# Patient Record
Sex: Female | Born: 1975 | Race: Black or African American | Hispanic: No | Marital: Single | Smoking: Current every day smoker
Health system: Southern US, Community
[De-identification: ages and names within clinical notes are randomized; demographics above are authoritative.]

## PROBLEM LIST (undated history)

## (undated) DIAGNOSIS — I10 Essential (primary) hypertension: Secondary | ICD-10-CM

## (undated) HISTORY — PX: ECTOPIC PREGNANCY SURGERY: SHX613

## (undated) HISTORY — DX: Essential (primary) hypertension: I10

## (undated) HISTORY — PX: SKIN BIOPSY: SHX1

---

## 1999-05-17 ENCOUNTER — Encounter: Payer: Self-pay | Admitting: Emergency Medicine

## 1999-05-17 ENCOUNTER — Emergency Department (HOSPITAL_COMMUNITY): Admission: EM | Admit: 1999-05-17 | Discharge: 1999-05-17 | Payer: Self-pay | Admitting: Emergency Medicine

## 2000-11-30 ENCOUNTER — Emergency Department (HOSPITAL_COMMUNITY): Admission: EM | Admit: 2000-11-30 | Discharge: 2000-11-30 | Payer: Self-pay | Admitting: Emergency Medicine

## 2000-11-30 ENCOUNTER — Encounter: Payer: Self-pay | Admitting: Emergency Medicine

## 2000-12-21 ENCOUNTER — Emergency Department (HOSPITAL_COMMUNITY): Admission: EM | Admit: 2000-12-21 | Discharge: 2000-12-21 | Payer: Self-pay | Admitting: Emergency Medicine

## 2000-12-23 ENCOUNTER — Emergency Department (HOSPITAL_COMMUNITY): Admission: EM | Admit: 2000-12-23 | Discharge: 2000-12-23 | Payer: Self-pay | Admitting: Emergency Medicine

## 2001-01-31 ENCOUNTER — Emergency Department (HOSPITAL_COMMUNITY): Admission: EM | Admit: 2001-01-31 | Discharge: 2001-01-31 | Payer: Self-pay | Admitting: Emergency Medicine

## 2001-09-22 ENCOUNTER — Encounter: Payer: Self-pay | Admitting: Emergency Medicine

## 2001-09-22 ENCOUNTER — Emergency Department (HOSPITAL_COMMUNITY): Admission: EM | Admit: 2001-09-22 | Discharge: 2001-09-22 | Payer: Self-pay | Admitting: Emergency Medicine

## 2001-12-28 ENCOUNTER — Emergency Department (HOSPITAL_COMMUNITY): Admission: EM | Admit: 2001-12-28 | Discharge: 2001-12-28 | Payer: Self-pay | Admitting: Emergency Medicine

## 2001-12-28 ENCOUNTER — Encounter: Payer: Self-pay | Admitting: Emergency Medicine

## 2002-02-22 ENCOUNTER — Emergency Department (HOSPITAL_COMMUNITY): Admission: EM | Admit: 2002-02-22 | Discharge: 2002-02-22 | Payer: Self-pay | Admitting: Emergency Medicine

## 2002-03-04 ENCOUNTER — Emergency Department (HOSPITAL_COMMUNITY): Admission: EM | Admit: 2002-03-04 | Discharge: 2002-03-04 | Payer: Self-pay

## 2002-07-26 ENCOUNTER — Emergency Department (HOSPITAL_COMMUNITY): Admission: EM | Admit: 2002-07-26 | Discharge: 2002-07-26 | Payer: Self-pay | Admitting: Emergency Medicine

## 2003-09-13 ENCOUNTER — Inpatient Hospital Stay (HOSPITAL_COMMUNITY): Admission: AD | Admit: 2003-09-13 | Discharge: 2003-09-13 | Payer: Self-pay | Admitting: Obstetrics & Gynecology

## 2003-12-06 ENCOUNTER — Emergency Department (HOSPITAL_COMMUNITY): Admission: EM | Admit: 2003-12-06 | Discharge: 2003-12-06 | Payer: Self-pay | Admitting: Family Medicine

## 2004-05-27 ENCOUNTER — Emergency Department (HOSPITAL_COMMUNITY): Admission: EM | Admit: 2004-05-27 | Discharge: 2004-05-27 | Payer: Self-pay | Admitting: Emergency Medicine

## 2004-07-03 ENCOUNTER — Emergency Department (HOSPITAL_COMMUNITY): Admission: EM | Admit: 2004-07-03 | Discharge: 2004-07-03 | Payer: Self-pay | Admitting: Emergency Medicine

## 2004-07-06 ENCOUNTER — Emergency Department (HOSPITAL_COMMUNITY): Admission: EM | Admit: 2004-07-06 | Discharge: 2004-07-06 | Payer: Self-pay | Admitting: Family Medicine

## 2004-11-17 ENCOUNTER — Inpatient Hospital Stay (HOSPITAL_COMMUNITY): Admission: AD | Admit: 2004-11-17 | Discharge: 2004-11-18 | Payer: Self-pay | Admitting: Obstetrics & Gynecology

## 2004-11-21 ENCOUNTER — Inpatient Hospital Stay (HOSPITAL_COMMUNITY): Admission: AD | Admit: 2004-11-21 | Discharge: 2004-11-21 | Payer: Self-pay | Admitting: Obstetrics and Gynecology

## 2004-11-24 ENCOUNTER — Inpatient Hospital Stay (HOSPITAL_COMMUNITY): Admission: AD | Admit: 2004-11-24 | Discharge: 2004-11-24 | Payer: Self-pay | Admitting: Family Medicine

## 2004-11-25 ENCOUNTER — Inpatient Hospital Stay (HOSPITAL_COMMUNITY): Admission: AD | Admit: 2004-11-25 | Discharge: 2004-11-25 | Payer: Self-pay | Admitting: Obstetrics and Gynecology

## 2004-11-28 ENCOUNTER — Inpatient Hospital Stay (HOSPITAL_COMMUNITY): Admission: AD | Admit: 2004-11-28 | Discharge: 2004-11-28 | Payer: Self-pay | Admitting: Obstetrics and Gynecology

## 2004-12-01 ENCOUNTER — Inpatient Hospital Stay (HOSPITAL_COMMUNITY): Admission: AD | Admit: 2004-12-01 | Discharge: 2004-12-01 | Payer: Self-pay | Admitting: Obstetrics and Gynecology

## 2004-12-07 ENCOUNTER — Inpatient Hospital Stay (HOSPITAL_COMMUNITY): Admission: AD | Admit: 2004-12-07 | Discharge: 2004-12-07 | Payer: Self-pay | Admitting: Obstetrics and Gynecology

## 2004-12-14 ENCOUNTER — Inpatient Hospital Stay (HOSPITAL_COMMUNITY): Admission: AD | Admit: 2004-12-14 | Discharge: 2004-12-14 | Payer: Self-pay | Admitting: Obstetrics and Gynecology

## 2004-12-26 ENCOUNTER — Inpatient Hospital Stay (HOSPITAL_COMMUNITY): Admission: AD | Admit: 2004-12-26 | Discharge: 2004-12-26 | Payer: Self-pay | Admitting: Obstetrics and Gynecology

## 2005-01-04 ENCOUNTER — Inpatient Hospital Stay (HOSPITAL_COMMUNITY): Admission: AD | Admit: 2005-01-04 | Discharge: 2005-01-04 | Payer: Self-pay | Admitting: Obstetrics and Gynecology

## 2005-01-18 ENCOUNTER — Inpatient Hospital Stay (HOSPITAL_COMMUNITY): Admission: AD | Admit: 2005-01-18 | Discharge: 2005-01-18 | Payer: Self-pay | Admitting: Obstetrics and Gynecology

## 2005-01-31 ENCOUNTER — Inpatient Hospital Stay (HOSPITAL_COMMUNITY): Admission: AD | Admit: 2005-01-31 | Discharge: 2005-01-31 | Payer: Self-pay | Admitting: Obstetrics and Gynecology

## 2005-02-24 ENCOUNTER — Inpatient Hospital Stay (HOSPITAL_COMMUNITY): Admission: AD | Admit: 2005-02-24 | Discharge: 2005-02-24 | Payer: Self-pay | Admitting: Obstetrics and Gynecology

## 2005-04-13 ENCOUNTER — Inpatient Hospital Stay (HOSPITAL_COMMUNITY): Admission: AD | Admit: 2005-04-13 | Discharge: 2005-04-13 | Payer: Self-pay | Admitting: Obstetrics and Gynecology

## 2005-08-28 ENCOUNTER — Inpatient Hospital Stay (HOSPITAL_COMMUNITY): Admission: AD | Admit: 2005-08-28 | Discharge: 2005-08-28 | Payer: Self-pay | Admitting: Family Medicine

## 2005-08-30 ENCOUNTER — Inpatient Hospital Stay (HOSPITAL_COMMUNITY): Admission: AD | Admit: 2005-08-30 | Discharge: 2005-08-30 | Payer: Self-pay | Admitting: *Deleted

## 2005-09-02 ENCOUNTER — Inpatient Hospital Stay (HOSPITAL_COMMUNITY): Admission: AD | Admit: 2005-09-02 | Discharge: 2005-09-02 | Payer: Self-pay | Admitting: Obstetrics & Gynecology

## 2005-09-07 ENCOUNTER — Inpatient Hospital Stay (HOSPITAL_COMMUNITY): Admission: AD | Admit: 2005-09-07 | Discharge: 2005-09-07 | Payer: Self-pay | Admitting: Obstetrics & Gynecology

## 2005-09-17 ENCOUNTER — Inpatient Hospital Stay (HOSPITAL_COMMUNITY): Admission: AD | Admit: 2005-09-17 | Discharge: 2005-09-17 | Payer: Self-pay | Admitting: Obstetrics

## 2005-09-19 ENCOUNTER — Ambulatory Visit (HOSPITAL_COMMUNITY): Admission: AD | Admit: 2005-09-19 | Discharge: 2005-09-19 | Payer: Self-pay | Admitting: Obstetrics

## 2005-09-19 ENCOUNTER — Encounter (INDEPENDENT_AMBULATORY_CARE_PROVIDER_SITE_OTHER): Payer: Self-pay | Admitting: Specialist

## 2005-11-29 ENCOUNTER — Emergency Department (HOSPITAL_COMMUNITY): Admission: EM | Admit: 2005-11-29 | Discharge: 2005-11-30 | Payer: Self-pay | Admitting: Emergency Medicine

## 2006-02-19 ENCOUNTER — Inpatient Hospital Stay (HOSPITAL_COMMUNITY): Admission: AD | Admit: 2006-02-19 | Discharge: 2006-02-19 | Payer: Self-pay | Admitting: Family Medicine

## 2006-02-21 ENCOUNTER — Inpatient Hospital Stay (HOSPITAL_COMMUNITY): Admission: AD | Admit: 2006-02-21 | Discharge: 2006-02-21 | Payer: Self-pay | Admitting: Family Medicine

## 2006-02-23 ENCOUNTER — Ambulatory Visit (HOSPITAL_COMMUNITY): Admission: AD | Admit: 2006-02-23 | Discharge: 2006-02-23 | Payer: Self-pay | Admitting: Obstetrics and Gynecology

## 2006-02-23 ENCOUNTER — Encounter (INDEPENDENT_AMBULATORY_CARE_PROVIDER_SITE_OTHER): Payer: Self-pay | Admitting: Specialist

## 2006-03-30 IMAGING — US US OB TRANSVAGINAL
1 series · 14 of 28 positions shown · non-contrast
Comparison: none

CLINICAL DATA: Given methotrexate on 11/18/04 for an ectopic pregnancy. However,
quantitative beta HCGs continue to increase. Lower abdominal pain.

ULTRASOUND OB TRANSVAGINAL:

[Series 1: us ob transvaginal · 0.15mm/px · 14 of 35 slices shown]
[im 2/35]
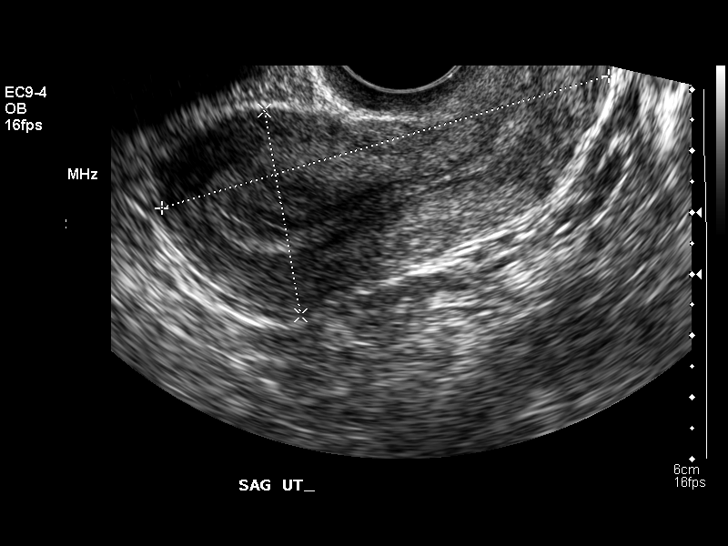
[im 4/35]
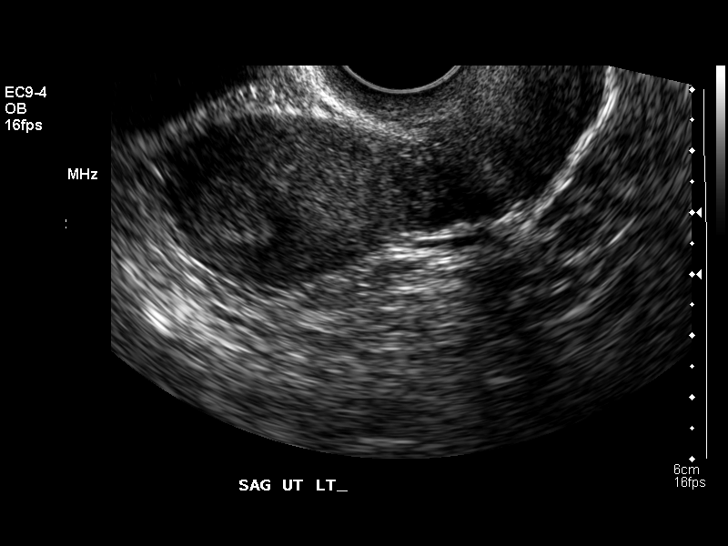
[im 7/35]
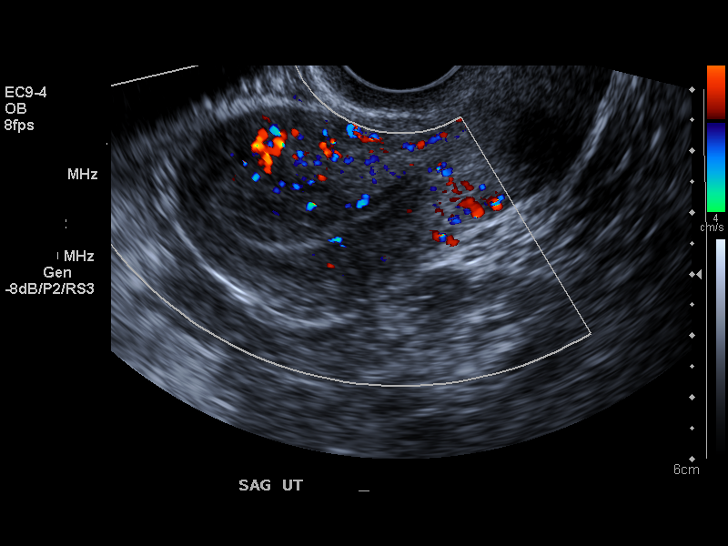
[im 9/35]
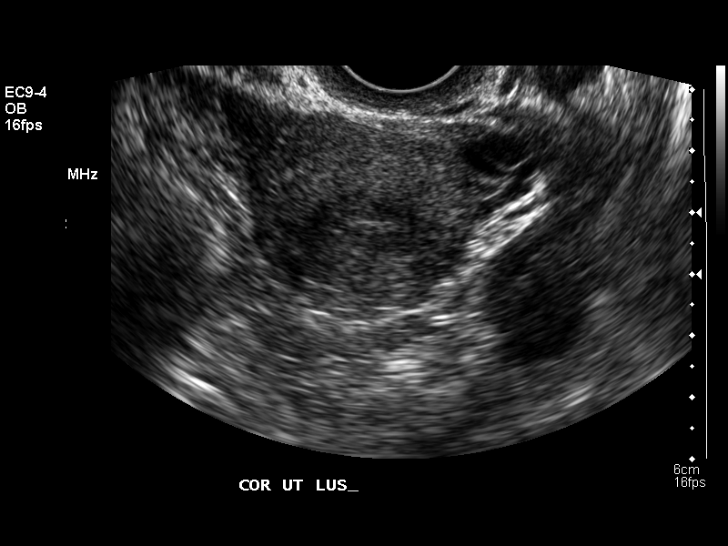
[im 12/35]
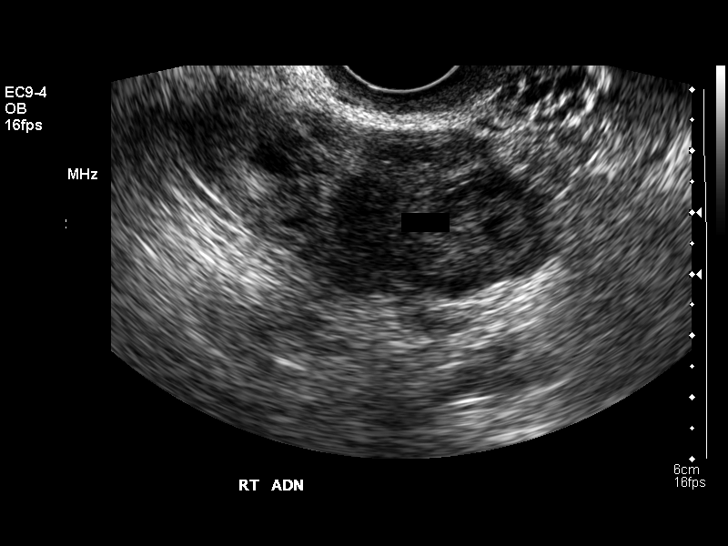
[im 14/35]
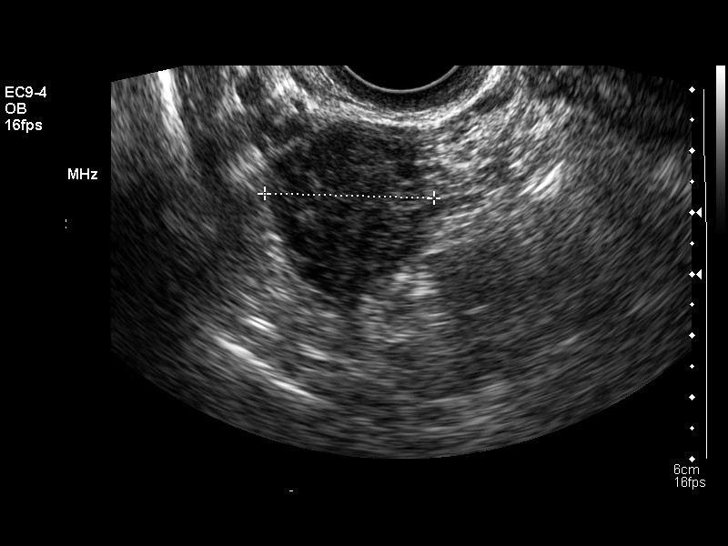
[im 17/35]
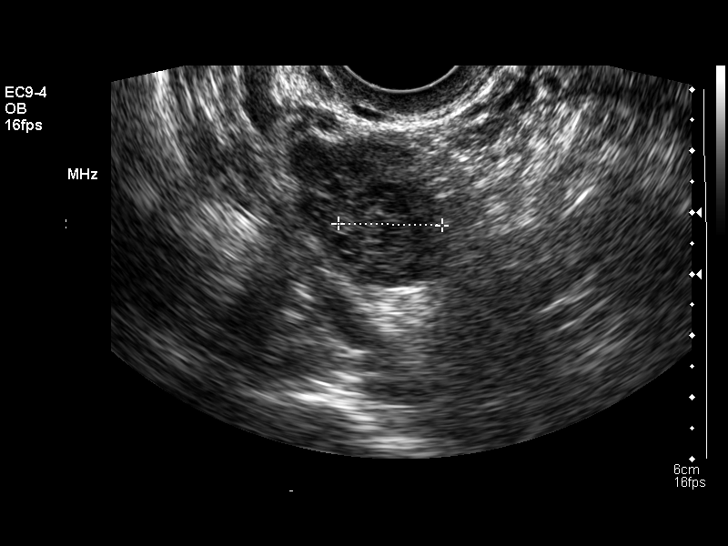
[im 19/35]
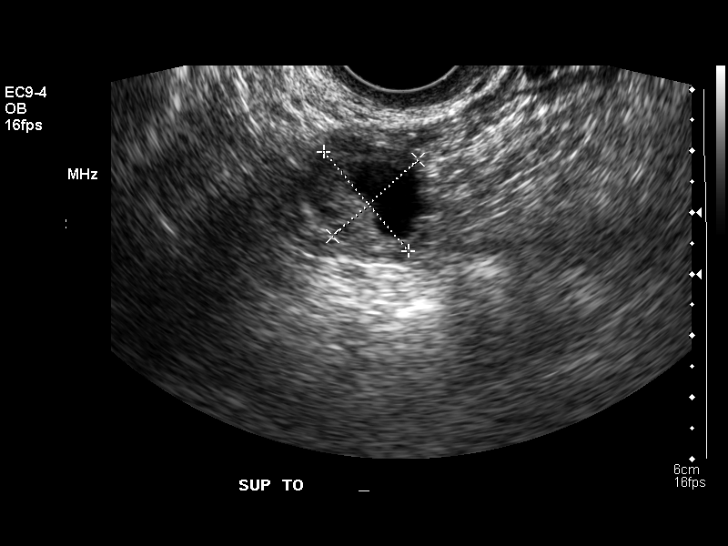
[im 22/35]
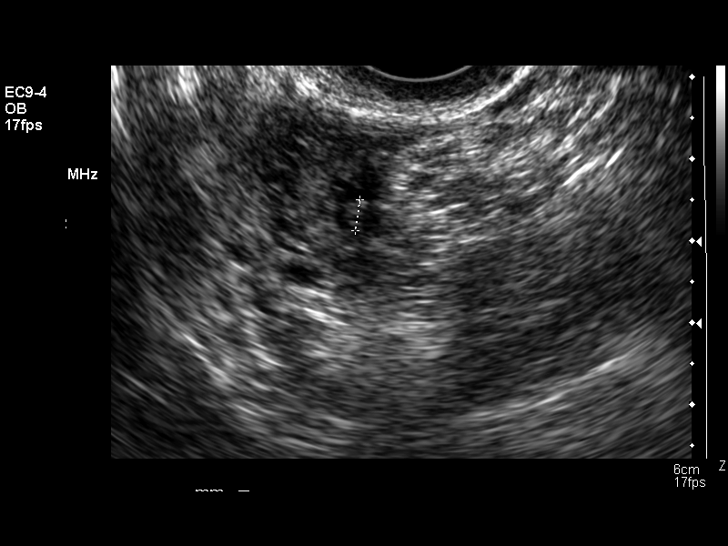
[im 24/35]
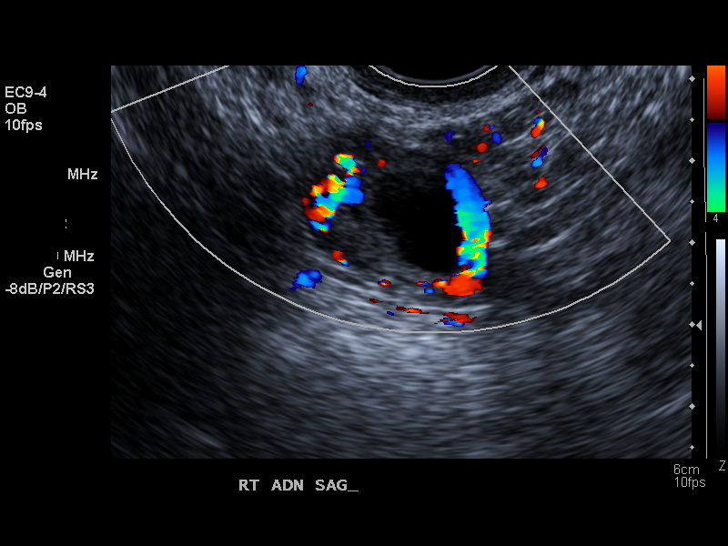
[im 27/35]
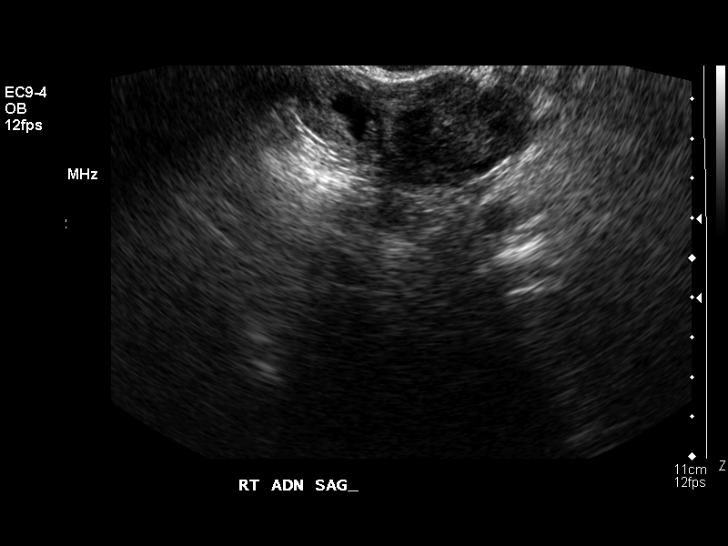
[im 29/35]
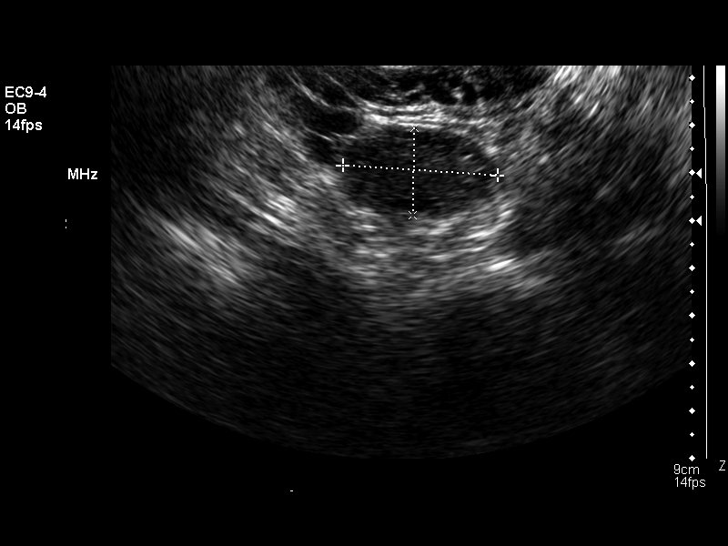
[im 32/35]
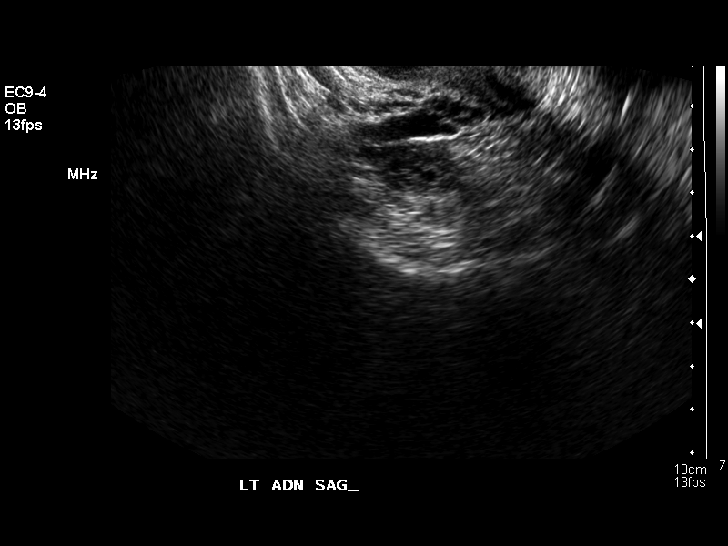
[im 35/35]
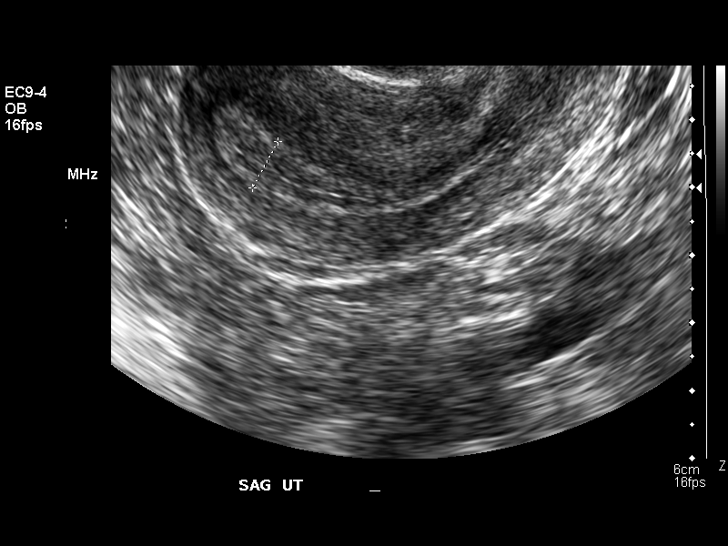

[14 of 28 positions shown; findings below may reference images not displayed]

FINDINGS: Now, there is a gestational sac noted within the right adnexal region.
This measures 2.1 x 1.9 x 1.7 cm. A yolk sac is present with possible fetal
pole. No fetal heart beat.

No intrauterine pregnancy. No free fluid. Left ovary normal.
IMPRESSION: Gestational sac with the yolk sac and possible fetal pole now seen within the
right adnexa. Maximum diameter of the area 2.1 cm. No intrauterine pregnancy.

## 2008-01-13 ENCOUNTER — Emergency Department (HOSPITAL_COMMUNITY): Admission: EM | Admit: 2008-01-13 | Discharge: 2008-01-13 | Payer: Self-pay | Admitting: Family Medicine

## 2008-09-28 ENCOUNTER — Emergency Department (HOSPITAL_COMMUNITY): Admission: EM | Admit: 2008-09-28 | Discharge: 2008-09-28 | Payer: Self-pay | Admitting: Emergency Medicine

## 2009-01-03 ENCOUNTER — Emergency Department (HOSPITAL_COMMUNITY): Admission: EM | Admit: 2009-01-03 | Discharge: 2009-01-03 | Payer: Self-pay | Admitting: Family Medicine

## 2009-07-07 ENCOUNTER — Emergency Department (HOSPITAL_COMMUNITY): Admission: EM | Admit: 2009-07-07 | Discharge: 2009-07-07 | Payer: Self-pay | Admitting: Family Medicine

## 2009-08-30 ENCOUNTER — Emergency Department (HOSPITAL_COMMUNITY): Admission: EM | Admit: 2009-08-30 | Discharge: 2009-08-30 | Payer: Self-pay | Admitting: Family Medicine

## 2009-09-06 ENCOUNTER — Emergency Department (HOSPITAL_COMMUNITY): Admission: EM | Admit: 2009-09-06 | Discharge: 2009-09-06 | Payer: Self-pay | Admitting: Emergency Medicine

## 2010-02-04 ENCOUNTER — Emergency Department (HOSPITAL_COMMUNITY): Admission: EM | Admit: 2010-02-04 | Discharge: 2010-02-04 | Payer: Self-pay | Admitting: Emergency Medicine

## 2010-03-17 ENCOUNTER — Emergency Department (HOSPITAL_COMMUNITY): Admission: EM | Admit: 2010-03-17 | Discharge: 2010-03-17 | Payer: Self-pay | Admitting: Emergency Medicine

## 2010-06-13 ENCOUNTER — Emergency Department (HOSPITAL_COMMUNITY)
Admission: EM | Admit: 2010-06-13 | Discharge: 2010-06-13 | Payer: Self-pay | Source: Home / Self Care | Admitting: Emergency Medicine

## 2010-10-05 NOTE — Op Note (Signed)
Julie Smith, STRUBEL NO.:  [id number]   MEDICAL RECORD NO.:  [id number]          PATIENT TYPE:  MAT   LOCATION:  [facility]                          FACILITY:  [facility]   PHYSICIAN:  Miguel Aschoff, M.D.       DATE OF BIRTH:  01/16/1976   DATE OF PROCEDURE:  02/23/2006  DATE OF DISCHARGE:                                 OPERATIVE REPORT   PREOPERATIVE DIAGNOSES:  Right ectopic pregnancy.   POSTOPERATIVE DIAGNOSES:  Right ectopic pregnancy.   PROCEDURE:  Laparoscopy with right partial salpingectomy.   SURGEON:  Miguel Aschoff, M.D.   ANESTHESIA:  General.   COMPLICATIONS:  None.   JUSTIFICATION:  The patient is a 35 year old black female, gravida 3, para  0, 0-2-0.  The patient has had history of two previous ectopic pregnancies,  one requiring a left salpingectomy.  She presented because of lower  abdominal pain, was diagnosed once again as having a right ectopic pregnancy  and this had been treated with methotrexate therapy on 02/23/2006.  The  patient reported increasing pain, was treated with Methotrexate on  02/21/2006.  The patient reported increasing pain on 02/23/2006, reporting  her pain 9 out of 10.  Because of increased symptomatology and a desire to  have the ectopic pregnancy resolved the patient is being taken to the  operating room to undergo laparoscopy.  It was discussed with the patient  and her partner as to the options available.  In view of this being her  third ectopic pregnancy the patient requested that a nonconservative  procedure be carried out and has given her informed consent for  salpingectomy.   PROCEDURE:  The patient was taken to the operating room, placed in the  supine position.  General anesthesia was administered without difficulty.  Once this was done, she was placed in dorsolithotomy position, prepped and  draped in usual sterile fashion.  Bladder was catheterized.  Once this was  done a Hulka tenaculum was placed through the cervix  and held.  Attention  was then directed to the umbilicus where a small infraumbilical incision was  made.  A Veress needle was inserted and the abdomen was insufflated with 3  liters of CO2.  Following this laparoscope was placed.  Inspection revealed  the uterus to be anterior, normal size and shape.  The left tube was  missing, consistent with a history of previous salpingectomy.  The left  ovary had some peri-ovarian adhesions but appeared to be normal.  Cul-de-sac  was unremarkable.  On the right however the patient had distal ampullary  ectopic pregnancy with a small amount of blood coming from the end of the  tube.  It was fusiform in shape and effecting the distal end of the tube.  No other abnormalities were noted in the pelvis.  At this point two  accessory ports were established, a 10 mm in the right lower quadrant and a  5 mm in the left lower quadrant.  The tube was elevated and then using the  gyrus unit the mesosalpinx was cauterized and cut.  This continued until the  ectopic pregnancy was detached and then the proximal portion of the tube was  cauterized and cut, leaving a small segment of proximal fallopian tube.  There was no fimbria left in this patient.  The specimen was excised.  The  excision site was inspected for hemostasis.  Hemostasis appeared to be  excellent.  At this point an EndoCatch bag was introduced and the specimen  was excised through the 10 mm port on the right without difficulty.  At this  point CO2 was allowed to escape.  Incisions closed using subcuticular 4-0  Vicryl.  The estimated blood loss was minimal from the procedure.  There was  a small amount of blood loss from the leaking ectopic pregnancy estimated to  be less than 20 mL.  Port sites were then injected with 0.25% Marcaine.  The  patient was reversed from the anesthetic and taken to recovery room in  satisfactory condition.  The ectopic pregnancy in the distal 2/3rds of the  tube were sent  for histologic study.   Plan is for the patient to be discharged home.  Medications for home include  Vicodin one every 3 hours as need for pain.  She is to set up a follow-up  visit in 4 weeks.  She is to call for any problems such as fever, pain or  heavy bleeding.  Place nothing per vagina for two weeks.      Miguel Aschoff, M.D.  Electronically Signed     AR/MEDQ  D:  02/23/2006  T:  02/24/2006  Job:  [id number]

## 2010-10-05 NOTE — Op Note (Signed)
Julie Smith, Julie Smith                   ACCOUNT NO.:  [id number]   MEDICAL RECORD NO.:  [id number]          PATIENT TYPE:  AMB   LOCATION:  [facility]                           FACILITY:  [facility]   PHYSICIAN:  Roseanna Rainbow, M.D.DATE OF BIRTH:  [birth year]   DATE OF PROCEDURE:  09/19/2005  DATE OF DISCHARGE:                                 OPERATIVE REPORT   PREOPERATIVE DIAGNOSIS:  Left ectopic pregnancy, rule out rupture.   POSTOPERATIVE DIAGNOSIS:  Left ectopic pregnancy, rule out rupture.   PROCEDURE:  Operative laparoscopy with left salpingectomy.   SURGEON:  Roseanna Rainbow, M.D.  Charles A. Clearance Coots, M.D.   ANESTHESIA:  General endotracheal anesthesia.   COMPLICATIONS:  None.   ESTIMATED BLOOD LOSS:  Minimal.   FINDINGS:  There was a hemoperitoneum noted approximately 50 to 100 mL.  The  ampullary portion of the left tube was dilated with products of conception  and blood clots.  There were fairly dense adhesions involving the omentum to  the peritoneum of the posterior cul-de-sac.  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  The  ovaries were normal appearing as well as the uterus.   PROCEDURE:  The patient is taken to the operating room with an IV running.  She was given general anesthesia and placed in the dorsal lithotomy position  and prepped and draped in the usual sterile fashion.  A speculum was placed  in the patient's vagina.  The anterior lip of the cervix was grasped with a  single-tooth tenaculum.  A Hulka manipulator was then advanced into the  uterus and secured to the anterior lip of the cervix as a means to  manipulate the uterus.  The single tooth tenaculum and speculum were then  removed.  A 1 cm periumbilical incision was then made with the scalpel.  The  Veress needle was then introduced into the abdominal cavity while tenting up  the anterior abdominal wall at a 45 degrees angle.   Intra-abdominal  placement was confirmed by saline drop test as well as an appropriate low  pressure reading upon insufflation of CO2 gas.  The abdomen was insufflated  with 3.5 liters of CO2 gas.  A trocar and sleeve were then advanced into the  abdomen where intra-abdominal placement was confirmed with the laparoscope.  A 5 mm left lower quadrant port was placed as well as a suprapubic port.  A  survey of the patient's pelvic anatomy revealed the above findings.  The  mesosalpinx of the left fallopian tube was then cauterized with bipolar  cautery and transected.  The pelvis was copiously irrigated and the  hemoperitoneum was evacuated.  The tube was then placed into an endocatch  and removed.  The fascia for the supraumbilical port was reapproximated with  interrupted sutures of 2-0 Vicryl.  The skin was reapproximated with 3-0  Monocryl and  Dermabond.  The Hulka manipulator was removed from the cervix with minimal  bleeding noted.  At the close of the procedure, the instrument and pack  counts  were said to be correct x2.  The patient was taken to the PACU awake  and in stable condition.      Roseanna Rainbow, M.D.  Electronically Signed     LAJ/MEDQ  D:  09/19/2005  T:  09/19/2005  Job:  [id number]

## 2011-01-03 IMAGING — CR DG CHEST 2V
2 series · 2 of 2 positions shown · non-contrast
Comparison: None.

CLINICAL DATA: Shortness of breath and cold symptoms.

CHEST - 2 VIEW

[view not recorded (1 of 2)]
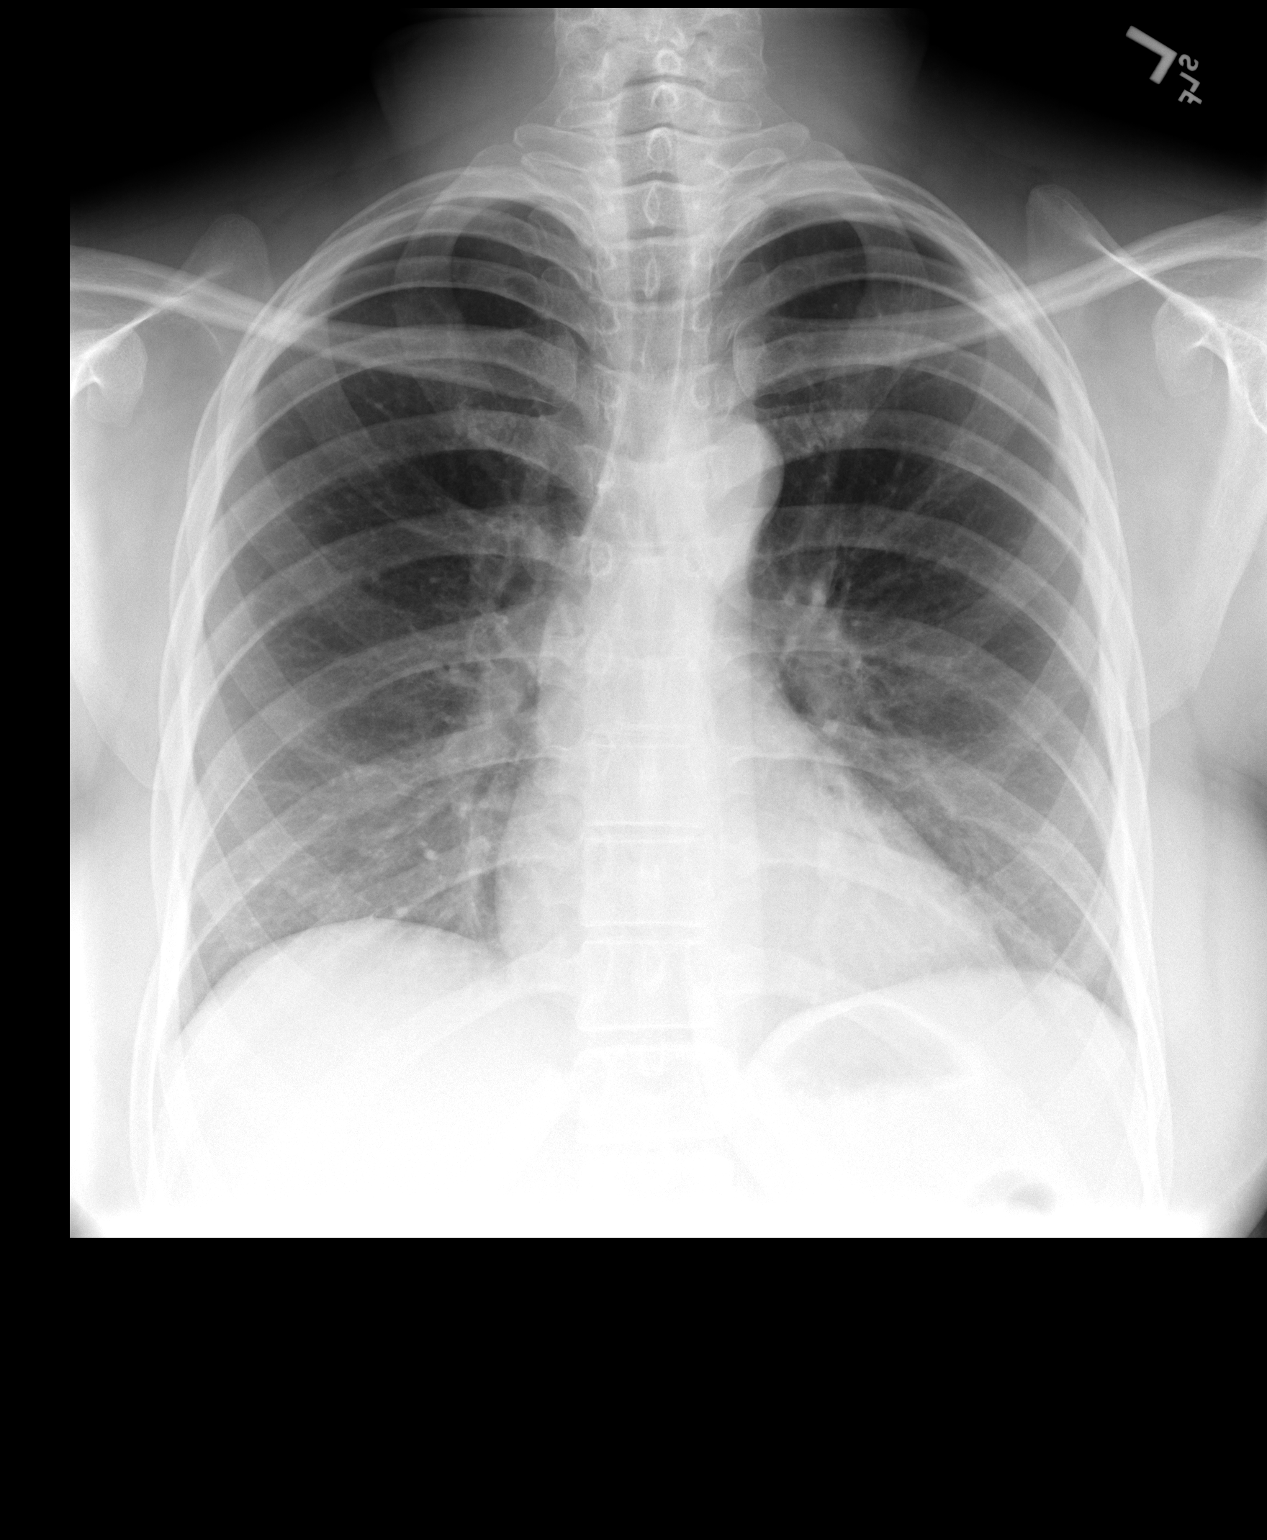

[view not recorded (2 of 2)]
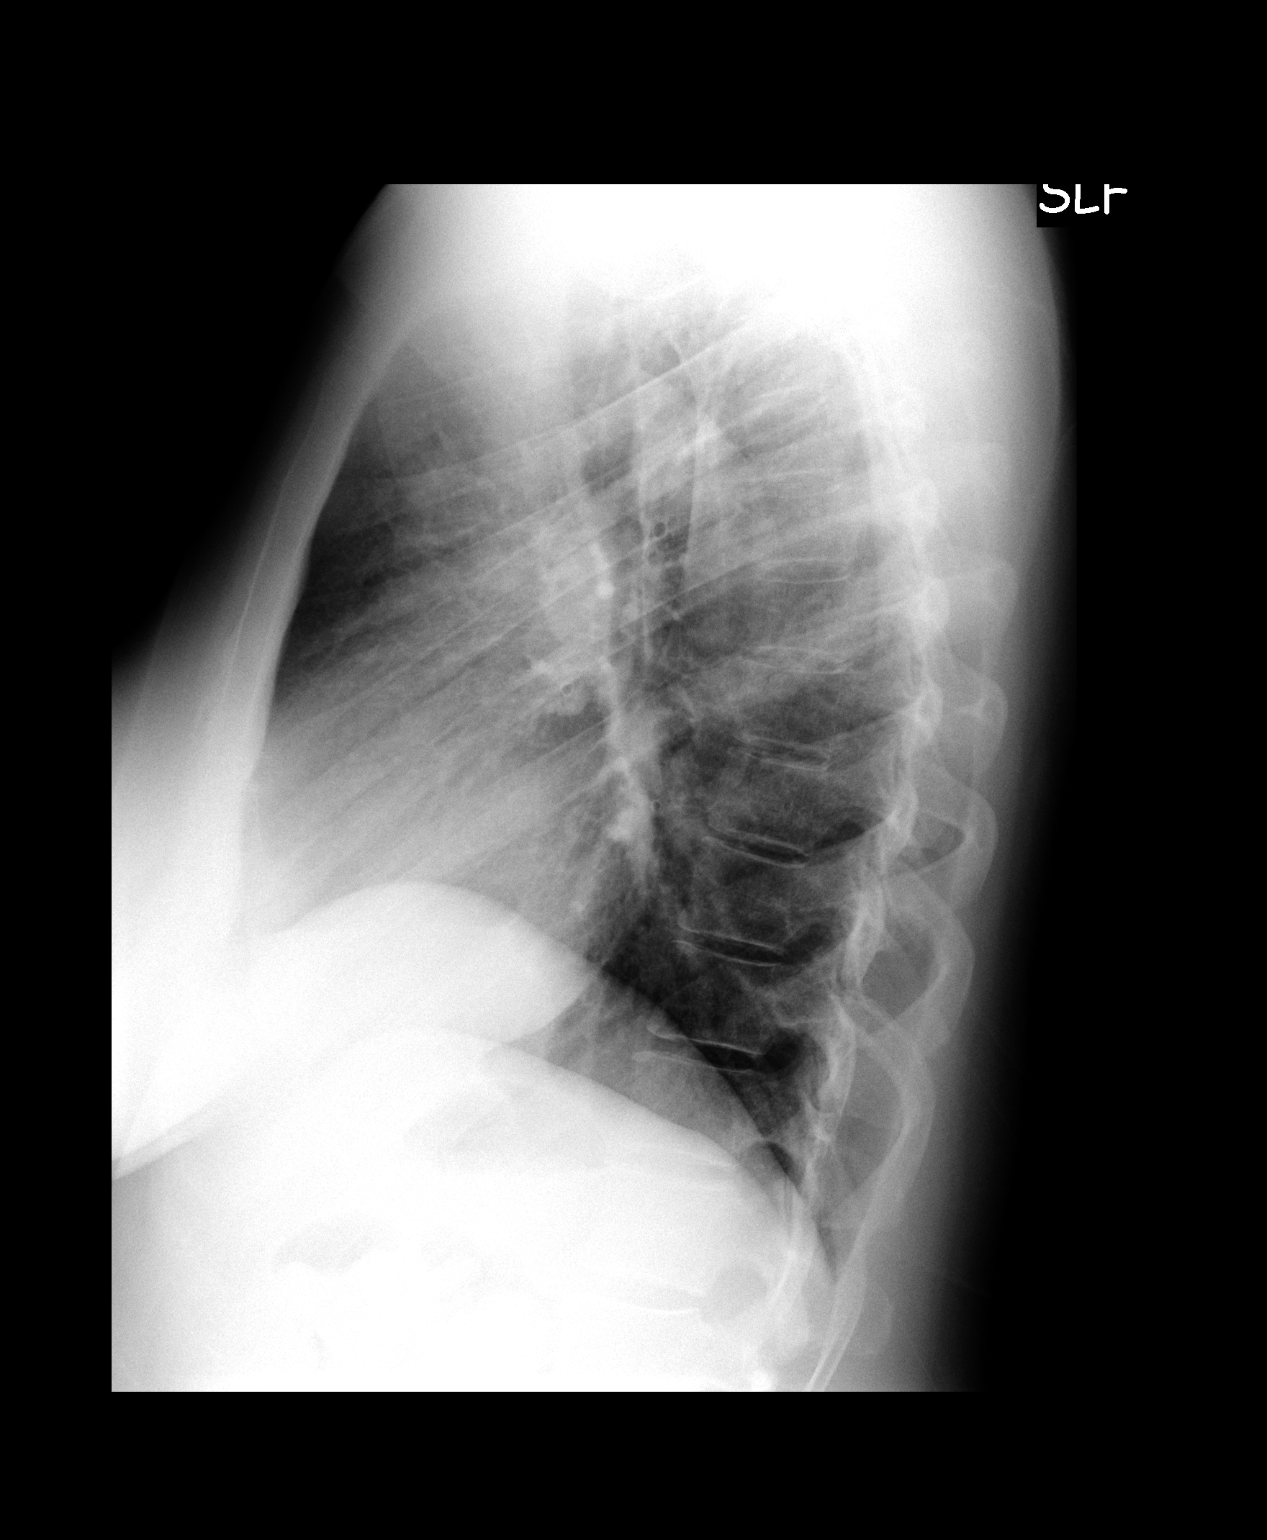

[2 of 2 positions shown; findings below may reference images not displayed]

FINDINGS: Two views of the chest were obtained.  Lungs clear
without focal airspace disease or edema.  Heart and mediastinum are
normal.  Trachea is midline.  Bony structures are intact.
IMPRESSION: No acute chest findings.

## 2011-01-10 IMAGING — CR DG CHEST 2V
2 series · 2 of 2 positions shown · non-contrast
Comparison: 08/30/2009

CLINICAL DATA: Short of breath.  Cough.

CHEST - 2 VIEW

[w chest pa]
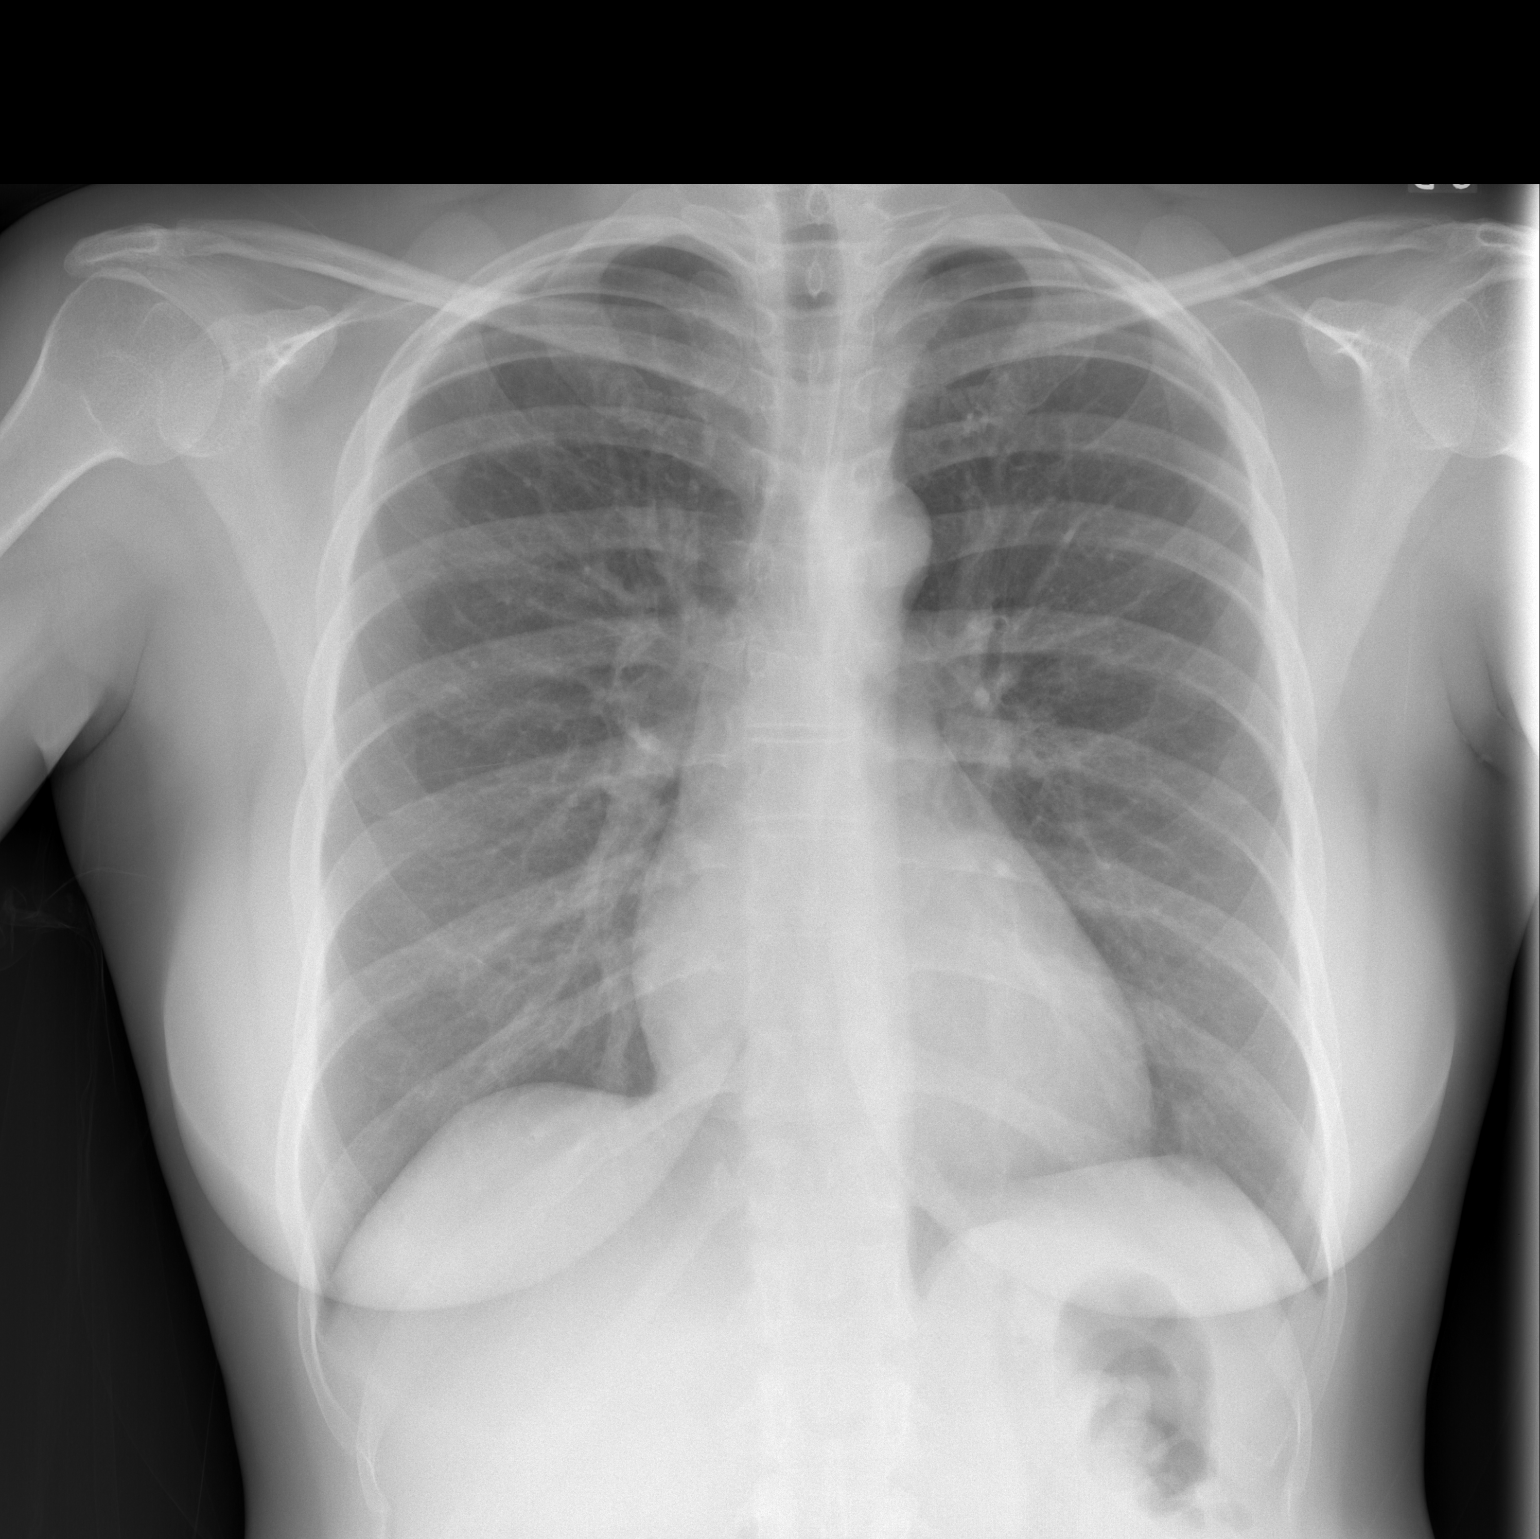

[w chest lat]
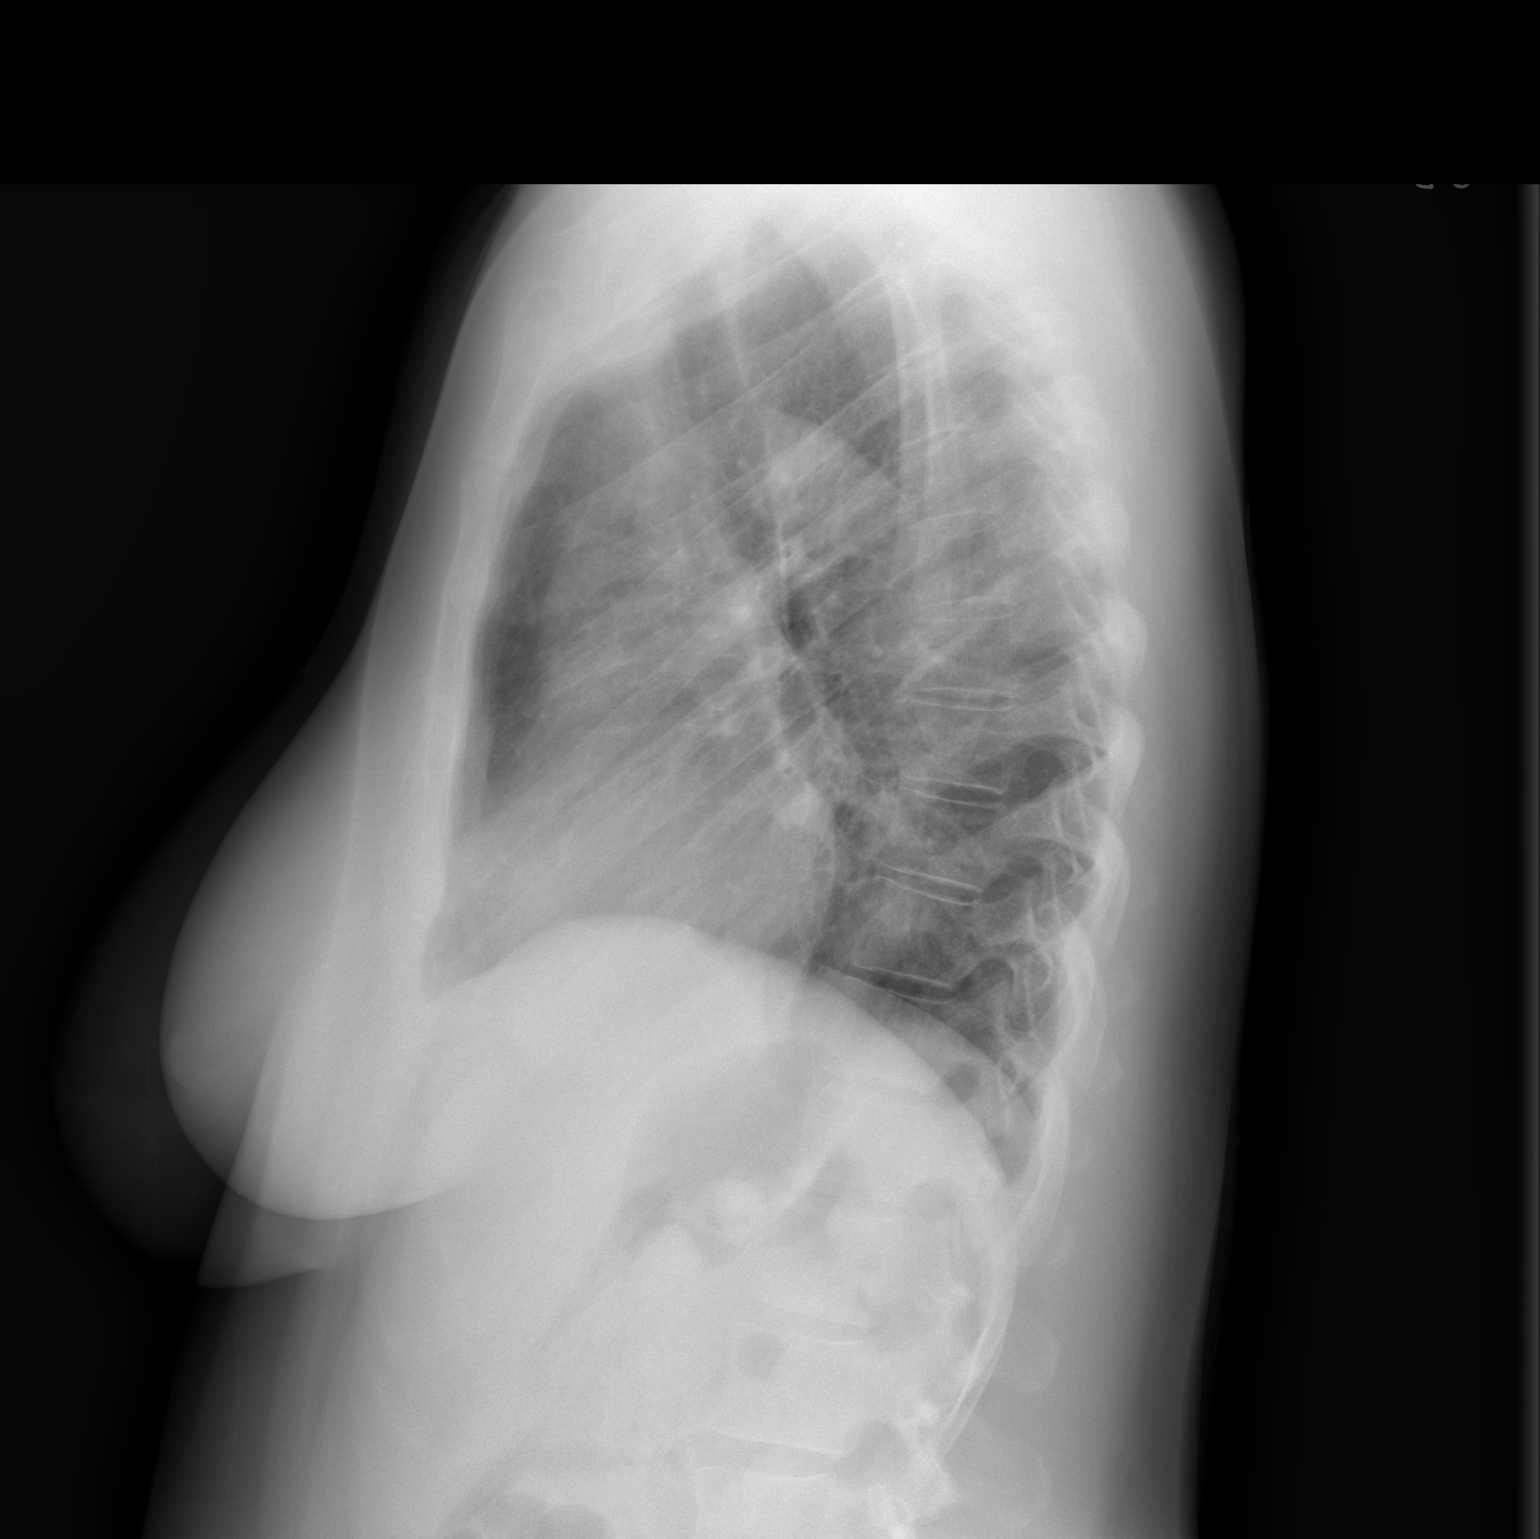

[2 of 2 positions shown; findings below may reference images not displayed]

FINDINGS: Heart size is normal.  The mediastinum is unremarkable.
There is bronchial thickening but no infiltrate, mass, effusion or
collapse.  No significant bony finding.
IMPRESSION: Bronchitis without consolidation or collapse.

## 2011-03-20 ENCOUNTER — Inpatient Hospital Stay (INDEPENDENT_AMBULATORY_CARE_PROVIDER_SITE_OTHER)
Admission: RE | Admit: 2011-03-20 | Discharge: 2011-03-20 | Disposition: A | Discharge status: Home / Self Care | Payer: Self-pay | Source: Ambulatory Visit | Attending: Family Medicine | Admitting: Family Medicine

## 2011-03-20 DIAGNOSIS — M545 Low back pain: Secondary | ICD-10-CM

## 2011-03-20 LAB — POCT URINALYSIS DIP (DEVICE)
Bilirubin Urine: NEGATIVE
Glucose, UA: NEGATIVE mg/dL
Hgb urine dipstick: NEGATIVE
Ketones, ur: NEGATIVE mg/dL
Nitrite: NEGATIVE
Specific Gravity, Urine: <=1.005 (ref 1.005–1.030)
pH: 6 (ref 5.0–8.0)

## 2011-03-22 LAB — URINE CULTURE
Colony Count: >=100000
Culture  Setup Time: 201210311825

## 2011-03-28 ENCOUNTER — Telehealth (HOSPITAL_COMMUNITY): Payer: Self-pay | Admitting: *Deleted

## 2011-03-28 NOTE — ED Notes (Signed)
Urine C and S: >100,000 colonies E. Coli.  Chart shown to Dr. Juanetta Gosling and order obtained for TMP-SMX DS tab po BID x 5 days #10.

## 2011-07-03 ENCOUNTER — Emergency Department (HOSPITAL_COMMUNITY)
Admission: EM | Admit: 2011-07-03 | Discharge: 2011-07-03 | Disposition: A | Discharge status: Home / Self Care | Payer: Self-pay | Source: Home / Self Care | Attending: Family Medicine | Admitting: Family Medicine

## 2011-07-03 ENCOUNTER — Encounter (HOSPITAL_COMMUNITY): Payer: Self-pay | Admitting: *Deleted

## 2011-07-03 DIAGNOSIS — H109 Unspecified conjunctivitis: Secondary | ICD-10-CM

## 2011-07-03 MED ORDER — POLYMYXIN B-TRIMETHOPRIM 10000-0.1 UNIT/ML-% OP SOLN: 1.0000 [drp] | OPHTHALMIC | Status: AC

## 2011-07-03 NOTE — ED Provider Notes (Signed)
History     CSN: [id number]  Arrival date & time 07/03/11  1618   First MD Initiated Contact with Patient 07/03/11 1630      Chief Complaint  Patient presents with  . Eye Pain  . Eye Problem    (Consider location/radiation/quality/duration/timing/severity/associated sxs/prior treatment) HPI Comments: Julie Smith presents for evaluation of left eye redness foreign body sensation and tearing. She reports that she thinks she had a eyelash or hair in her eye yesterday. She was trying to retrieve it and was ultimately successful in removing the eyelash. She now reports blurry vision, and persistent tearing. She denies any pain. She does not wear contacts or eyeglasses.  Patient is a 36 y.o. female presenting with eye problem. The history is provided by the patient.  Eye Problem  This is a new problem. The current episode started yesterday. The problem occurs constantly. The problem has not changed since onset.There is pain in the left eye. The injury mechanism was a foreign body. The patient is experiencing no pain. There is no history of trauma to the eye. There is no known exposure to pink eye. She does not wear contacts. Associated symptoms include blurred vision, foreign body sensation and eye redness. Pertinent negatives include no decreased vision, no discharge, no double vision, no photophobia and no itching. She has tried nothing for the symptoms.    History reviewed. No pertinent past medical history.  Past Surgical History  Procedure Date  . Ectopic pregnancy surgery     History reviewed. No pertinent family history.  History  Substance Use Topics  . Smoking status: Current Everyday Smoker  . Smokeless tobacco: Not on file  . Alcohol Use: Yes    OB History    Grav Para Term Preterm Abortions TAB SAB Ect Mult Living                  Review of Systems  Constitutional: Negative.   HENT: Negative.   Eyes: Positive for blurred vision and redness. Negative for double vision,  photophobia, discharge and visual disturbance.  Respiratory: Negative.   Cardiovascular: Negative.   Gastrointestinal: Negative.   Genitourinary: Negative.   Musculoskeletal: Negative.   Skin: Negative.  Negative for itching.  Neurological: Negative.     Allergies  Codeine  Home Medications   Current Outpatient Rx  Name Route Sig Dispense Refill  . POLYMYXIN B-TRIMETHOPRIM 10000-0.1 UNIT/ML-% OP SOLN Both Eyes Place 1 drop into both eyes every 4 (four) hours. 10 mL 0    BP 144/95  Pulse 63  Temp(Src) 98.9 F (37.2 C) (Oral)  Resp 18  SpO2 99%  LMP 06/22/2011  Physical Exam  Nursing note and vitals reviewed. Constitutional: She is oriented to person, place, and time. She appears well-developed and well-nourished.  HENT:  Head: Normocephalic and atraumatic.  Eyes: EOM and lids are normal. Pupils are equal, round, and reactive to light. Right eye exhibits no exudate. No foreign body present in the right eye. Left eye exhibits no exudate. No foreign body present in the left eye. Left conjunctiva is injected.  Neck: Normal range of motion.  Pulmonary/Chest: Effort normal.  Musculoskeletal: Normal range of motion.  Neurological: She is alert and oriented to person, place, and time.  Skin: Skin is warm and dry.  Psychiatric: Her behavior is normal.    ED Course  Procedures (including critical care time)  Labs Reviewed - No data to display No results found.   1. Conjunctivitis       MDM  rx given for polytrim eye drops; return if no improvement        Richardo Priest, MD 07/03/11 1813

## 2011-07-03 NOTE — ED Notes (Signed)
Pt with onset of left eye redness/drainage/pain yesterday now right eye with some redness and irritation

## 2011-07-03 NOTE — Discharge Instructions (Signed)
Use eyedrops as directed. Exercise proper hygiene with handwashing, not touching the face or eye, not sharing towels or other clothing. Return to care should your symptoms not improve, or worsen in any way, or any visual disturbance.   

## 2013-01-24 ENCOUNTER — Encounter (HOSPITAL_COMMUNITY): Payer: Self-pay | Admitting: Emergency Medicine

## 2013-01-24 ENCOUNTER — Emergency Department (HOSPITAL_COMMUNITY)
Admission: EM | Admit: 2013-01-24 | Discharge: 2013-01-24 | Disposition: A | Discharge status: Home / Self Care | Payer: Self-pay | Attending: Emergency Medicine | Admitting: Emergency Medicine

## 2013-01-24 DIAGNOSIS — F17200 Nicotine dependence, unspecified, uncomplicated: Secondary | ICD-10-CM | POA: Insufficient documentation

## 2013-01-24 DIAGNOSIS — N390 Urinary tract infection, site not specified: Secondary | ICD-10-CM | POA: Insufficient documentation

## 2013-01-24 DIAGNOSIS — N12 Tubulo-interstitial nephritis, not specified as acute or chronic: Secondary | ICD-10-CM | POA: Insufficient documentation

## 2013-01-24 DIAGNOSIS — R350 Frequency of micturition: Secondary | ICD-10-CM | POA: Insufficient documentation

## 2013-01-24 DIAGNOSIS — Z3202 Encounter for pregnancy test, result negative: Secondary | ICD-10-CM | POA: Insufficient documentation

## 2013-01-24 LAB — URINE MICROSCOPIC-ADD ON

## 2013-01-24 LAB — URINALYSIS, ROUTINE W REFLEX MICROSCOPIC
Bilirubin Urine: NEGATIVE
Glucose, UA: NEGATIVE mg/dL
Ketones, ur: NEGATIVE mg/dL
Protein, ur: NEGATIVE mg/dL
Urobilinogen, UA: 1 mg/dL (ref 0.0–1.0)

## 2013-01-24 LAB — PREGNANCY, URINE: Preg Test, Ur: NEGATIVE

## 2013-01-24 MED ORDER — IBUPROFEN 800 MG PO TABS
800.0000 mg | ORAL_TABLET | Freq: Once | ORAL | Status: AC
  Administered 2013-01-24: 800 mg via ORAL
  Filled 2013-01-24: qty 1

## 2013-01-24 MED ORDER — LEVOFLOXACIN 500 MG PO TABS
750.0000 mg | ORAL_TABLET | Freq: Once | ORAL | Status: AC
  Administered 2013-01-24: 750 mg via ORAL
  Filled 2013-01-24: qty 2

## 2013-01-24 MED ORDER — LEVOFLOXACIN 750 MG PO TABS: 750.0000 mg | ORAL_TABLET | Freq: Every day | ORAL | Status: DC

## 2013-01-24 MED ORDER — ONDANSETRON 4 MG PO TBDP: ORAL_TABLET | ORAL | Status: DC

## 2013-01-24 NOTE — ED Provider Notes (Signed)
CSN: [id number]     Arrival date & time 01/24/13  2112 History   First MD Initiated Contact with Patient 01/24/13 2138     Chief Complaint  Patient presents with  . Flank Pain   (Consider location/radiation/quality/duration/timing/severity/associated sxs/prior Treatment) HPI Comments: 37 yo female with uti, smoking hx presents with left flank pain for two days, ache/ sharp, constant.  Nothing specific worsens.  Frequent urination.  No fevers.  No vomiting.  Nothing improves.  No kidney stone hx.   Patient is a 36 y.o. female presenting with flank pain. The history is provided by the patient.  Flank Pain This is a new problem. Pertinent negatives include no chest pain, no abdominal pain, no headaches and no shortness of breath.    History reviewed. No pertinent past medical history. Past Surgical History  Procedure Laterality Date  . Ectopic pregnancy surgery     No family history on file. History  Substance Use Topics  . Smoking status: Current Every Day Smoker  . Smokeless tobacco: Not on file  . Alcohol Use: Yes     Comment: occasional   OB History   Grav Para Term Preterm Abortions TAB SAB Ect Mult Living                 Review of Systems  Constitutional: Negative for fever and chills.  HENT: Negative for neck pain and neck stiffness.   Eyes: Negative for visual disturbance.  Respiratory: Negative for shortness of breath.   Cardiovascular: Negative for chest pain.  Gastrointestinal: Negative for vomiting and abdominal pain.  Genitourinary: Positive for frequency and flank pain. Negative for dysuria.  Musculoskeletal: Negative for back pain.  Skin: Negative for rash.  Neurological: Negative for light-headedness and headaches.    Allergies  Codeine  Home Medications   Current Outpatient Rx  Name  Route  Sig  Dispense  Refill  . naproxen sodium (ANAPROX) 220 MG tablet   Oral   Take 660 mg by mouth 2 (two) times daily as needed (pain).          BP 150/106   Pulse 97  Temp(Src) 99.4 F (37.4 C) (Oral)  Resp 15  Ht 5\' 11"  (1.803 m)  Wt 190 lb (86.183 kg)  BMI 26.51 kg/m2  SpO2 98%  LMP 01/01/2013 Physical Exam  Nursing note and vitals reviewed. Constitutional: She is oriented to person, place, and time. She appears well-developed and well-nourished.  HENT:  Head: Normocephalic and atraumatic.  Mild dry mm  Eyes: Conjunctivae are normal. Right eye exhibits no discharge. Left eye exhibits no discharge.  Neck: Normal range of motion. Neck supple. No tracheal deviation present.  Cardiovascular: Normal rate and regular rhythm.   Pulmonary/Chest: Effort normal and breath sounds normal.  Abdominal: Soft. She exhibits no distension. There is no tenderness. There is no guarding.  Musculoskeletal: She exhibits no edema.  Left flank pain mild  Neurological: She is alert and oriented to person, place, and time.  Skin: Skin is warm. No rash noted.  Psychiatric: She has a normal mood and affect.    ED Course  Procedures (including critical care time) Emergency Focused Ultrasound Exam Limited retroperitoneal ultrasound of kidneys  Performed and interpreted by Dr. Jodi Mourning Indication: flank pain Focused abdominal ultrasound with both kidneys imaged in transverse and longitudinal planes in real-time. Interpretation: no hydronephrosis visualized.  No stones or cysts visualized  Images archived electronically  Labs Review Labs Reviewed  URINALYSIS, ROUTINE W REFLEX MICROSCOPIC - Abnormal; Notable for the  following:    APPearance CLOUDY (*)    Hgb urine dipstick LARGE (*)    Nitrite POSITIVE (*)    Leukocytes, UA MODERATE (*)    All other components within normal limits  URINE MICROSCOPIC-ADD ON - Abnormal; Notable for the following:    Squamous Epithelial / LPF FEW (*)    Bacteria, UA MANY (*)    All other components within normal limits  URINE CULTURE  PREGNANCY, URINE   Imaging Review No results found.  MDM  No diagnosis  found. Stone vs pyelo. Bedside US no hydro. Urine infected.  No vomiting.  PO fluids and po abx. Plan for close outpt fup.  DC    Enid Skeens, MD 01/24/13 2240

## 2013-01-24 NOTE — ED Notes (Signed)
Pt c/o L flank pain onset 2-3 days ago, frequent urination, denies pain, denies fevers or hematuria

## 2013-01-27 LAB — URINE CULTURE

## 2013-06-21 ENCOUNTER — Emergency Department (HOSPITAL_COMMUNITY)
Admission: EM | Admit: 2013-06-21 | Discharge: 2013-06-21 | Disposition: A | Discharge status: Home / Self Care | Payer: BC Managed Care – PPO | Attending: Emergency Medicine | Admitting: Emergency Medicine

## 2013-06-21 ENCOUNTER — Encounter (HOSPITAL_COMMUNITY): Payer: Self-pay | Admitting: Emergency Medicine

## 2013-06-21 DIAGNOSIS — R55 Syncope and collapse: Secondary | ICD-10-CM | POA: Insufficient documentation

## 2013-06-21 DIAGNOSIS — Z3202 Encounter for pregnancy test, result negative: Secondary | ICD-10-CM | POA: Insufficient documentation

## 2013-06-21 DIAGNOSIS — F17200 Nicotine dependence, unspecified, uncomplicated: Secondary | ICD-10-CM | POA: Insufficient documentation

## 2013-06-21 DIAGNOSIS — R51 Headache: Secondary | ICD-10-CM | POA: Insufficient documentation

## 2013-06-21 LAB — BASIC METABOLIC PANEL
BUN: 16 mg/dL (ref 6–23)
CO2: 21 mEq/L (ref 19–32)
CREATININE: 0.82 mg/dL (ref 0.50–1.10)
Calcium: 9.2 mg/dL (ref 8.4–10.5)
Chloride: 104 mEq/L (ref 96–112)
GLUCOSE: 94 mg/dL (ref 70–99)
Potassium: 4.2 mEq/L (ref 3.7–5.3)
Sodium: 138 mEq/L (ref 137–147)

## 2013-06-21 LAB — CBC
HEMATOCRIT: 40.7 % (ref 36.0–46.0)
HEMOGLOBIN: 14.1 g/dL (ref 12.0–15.0)
MCH: 31.9 pg (ref 26.0–34.0)
MCHC: 34.6 g/dL (ref 30.0–36.0)
MCV: 92.1 fL (ref 78.0–100.0)
Platelets: 228 10*3/uL (ref 150–400)
RBC: 4.42 MIL/uL (ref 3.87–5.11)
RDW: 12 % (ref 11.5–15.5)
WBC: 11.3 10*3/uL — ABNORMAL HIGH (ref 4.0–10.5)

## 2013-06-21 LAB — POCT PREGNANCY, URINE: Preg Test, Ur: NEGATIVE

## 2013-06-21 NOTE — ED Provider Notes (Signed)
CSN: [id number]     Arrival date & time 06/21/13  0722 History   First MD Initiated Contact with Patient 06/21/13 0801     Chief Complaint  Patient presents with  . Near Syncope   (Consider location/radiation/quality/duration/timing/severity/associated sxs/prior Treatment) Patient is a 38 y.o. female presenting with near-syncope. The history is provided by the patient.  Near Syncope This is a new problem. The current episode started 1 to 2 hours ago. Episode frequency: once. The problem has been resolved. Associated symptoms include headaches (last night, but not today). Pertinent negatives include no chest pain, no abdominal pain and no shortness of breath. Nothing aggravates the symptoms. Nothing relieves the symptoms. She has tried nothing for the symptoms. The treatment provided significant relief.    History reviewed. No pertinent past medical history. Past Surgical History  Procedure Laterality Date  . Ectopic pregnancy surgery     History reviewed. No pertinent family history. History  Substance Use Topics  . Smoking status: Current Every Day Smoker  . Smokeless tobacco: Not on file  . Alcohol Use: Yes     Comment: occasional   OB History   Grav Para Term Preterm Abortions TAB SAB Ect Mult Living                 Review of Systems  Constitutional: Negative for fever and fatigue.  HENT: Negative for congestion and drooling.   Eyes: Negative for pain.  Respiratory: Negative for cough and shortness of breath.   Cardiovascular: Positive for near-syncope. Negative for chest pain.  Gastrointestinal: Negative for nausea, vomiting, abdominal pain and diarrhea.  Genitourinary: Negative for dysuria and hematuria.  Musculoskeletal: Negative for back pain, gait problem and neck pain.  Skin: Negative for color change.  Neurological: Positive for headaches (last night, but not today). Negative for dizziness.  Hematological: Negative for adenopathy.  Psychiatric/Behavioral: Negative  for behavioral problems.  All other systems reviewed and are negative.    Allergies  Codeine  Home Medications   Current Outpatient Rx  Name  Route  Sig  Dispense  Refill  . levofloxacin (LEVAQUIN) 750 MG tablet   Oral   Take 1 tablet (750 mg total) by mouth daily.   4 tablet   0   . naproxen sodium (ANAPROX) 220 MG tablet   Oral   Take 660 mg by mouth 2 (two) times daily as needed (pain).         . ondansetron (ZOFRAN ODT) 4 MG disintegrating tablet      4mg  ODT q4 hours prn nausea/vomit   4 tablet   0    BP 111/75  Pulse 94  Temp(Src) 98.4 F (36.9 C) (Oral)  Resp 16  SpO2 97% Physical Exam  Nursing note and vitals reviewed. Constitutional: She is oriented to person, place, and time. She appears well-developed and well-nourished.  HENT:  Head: Normocephalic.  Mouth/Throat: No oropharyngeal exudate.  Eyes: Conjunctivae and EOM are normal. Pupils are equal, round, and reactive to light.  Neck: Normal range of motion. Neck supple.  Cardiovascular: Normal rate, regular rhythm, normal heart sounds and intact distal pulses.  Exam reveals no gallop and no friction rub.   No murmur heard. Pulmonary/Chest: Effort normal and breath sounds normal. No respiratory distress. She has no wheezes.  Abdominal: Soft. Bowel sounds are normal. There is no tenderness. There is no rebound and no guarding.  Musculoskeletal: Normal range of motion. She exhibits no edema and no tenderness.  Neurological: She is alert and oriented to  person, place, and time. She has normal strength. No cranial nerve deficit or sensory deficit. She displays a negative Romberg sign. Coordination and gait normal.  Normal speech and understanding.  Normal finger to nose bilaterally.  The patient ambulate forwards and backwards without any difficulty. Normal tandem gait.  Skin: Skin is warm and dry.  Psychiatric: She has a normal mood and affect. Her behavior is normal.    ED Course  Procedures  (including critical care time) Labs Review Labs Reviewed  CBC - Abnormal; Notable for the following:    WBC 11.3 (*)    All other components within normal limits  BASIC METABOLIC PANEL  POCT PREGNANCY, URINE   Imaging Review No results found.  EKG Interpretation    Date/Time:  Monday June 21 2013 08:33:11 EST Ventricular Rate:  65 PR Interval:  157 QRS Duration: 71 QT Interval:  402 QTC Calculation: 418 R Axis:   42 Text Interpretation:  Sinus rhythm No significant change since last tracing Confirmed by Mieka Leaton  MD, Avraham Benish ([id number]) on 06/21/2013 8:38:42 AM            MDM   1. Near syncope    8:22 AM 38 y.o. female who presents with a near syncopal episode which occurred this morning. The patient states she felt lightheaded while in the shower and afterwards laid down on her bed. She notes her symptoms lasted approximately 20 minutes and she is now asymptomatic. She notes that her menstrual cycle started today as well. She is afebrile and vital signs are unremarkable here. She denies any abdominal pain, headache, vomiting, fever. Screening labwork are descended. Will get EKG and po hydrate.  11:11 AM: I interpreted/reviewed the labs and/or imaging which were non-contributory.  Pt remains asx. Will have her monitor her sx at home.  I have discussed the diagnosis/risks/treatment options with the patient and believe the pt to be eligible for discharge home to follow-up with pcp as needed. We also discussed returning to the ED immediately if new or worsening sx occur. We discussed the sx which are most concerning (e.g., further episodes of syncope/near syncope, abd pain) that necessitate immediate return. Medications administered to the patient during their visit and any new prescriptions provided to the patient are listed below.  Medications given during this visit Medications - No data to display  New Prescriptions   No medications on file     Junius Argyle,  MD 06/21/13 1615

## 2013-06-21 NOTE — ED Notes (Signed)
Pt reports she was in the shower and felt lightheaded/dizzy, did not LOC, but felt close. Pt then started her menstrual cycle right after incidient. At present denies dizziness or blurry vision. Denies physical pain, denies n/v/d.

## 2014-05-16 ENCOUNTER — Encounter (HOSPITAL_COMMUNITY): Payer: Self-pay

## 2014-05-16 ENCOUNTER — Emergency Department (HOSPITAL_COMMUNITY)
Admission: EM | Admit: 2014-05-16 | Discharge: 2014-05-16 | Disposition: A | Discharge status: Home / Self Care | Payer: BC Managed Care – PPO | Attending: Emergency Medicine | Admitting: Emergency Medicine

## 2014-05-16 DIAGNOSIS — Z720 Tobacco use: Secondary | ICD-10-CM | POA: Insufficient documentation

## 2014-05-16 DIAGNOSIS — Z792 Long term (current) use of antibiotics: Secondary | ICD-10-CM | POA: Insufficient documentation

## 2014-05-16 DIAGNOSIS — Z791 Long term (current) use of non-steroidal anti-inflammatories (NSAID): Secondary | ICD-10-CM | POA: Insufficient documentation

## 2014-05-16 DIAGNOSIS — K047 Periapical abscess without sinus: Secondary | ICD-10-CM | POA: Insufficient documentation

## 2014-05-16 MED ORDER — IBUPROFEN 200 MG PO TABS: 400.0000 mg | ORAL_TABLET | Freq: Once | ORAL | Status: DC

## 2014-05-16 MED ORDER — AMOXICILLIN 500 MG PO CAPS: 500.0000 mg | ORAL_CAPSULE | Freq: Three times a day (TID) | ORAL | Status: DC

## 2014-05-16 NOTE — ED Notes (Signed)
Pt with dental pain and facial swelling since Thursday. No dentist.  No fever.

## 2014-05-16 NOTE — Discharge Instructions (Signed)
It was our pleasure to provide your ER care today - we hope that you feel better.  Take motrin or aleve as need for pain.   Take amoxicillin as prescribed.   Follow up with dentist in the next few days - see referral  - call office to arrange follow up appointment.    Return to ER if worse, facial/neck swelling, high fevers, intractable pain, trouble breathing or swallowing, other concern.       Dental Abscess A dental abscess is a collection of infected fluid (pus) from a bacterial infection in the inner part of the tooth (pulp). It usually occurs at the end of the tooth's root.  CAUSES   Severe tooth decay.  Trauma to the tooth that allows bacteria to enter into the pulp, such as a broken or chipped tooth. SYMPTOMS   Severe pain in and around the infected tooth.  Swelling and redness around the abscessed tooth or in the mouth or face.  Tenderness.  Pus drainage.  Bad breath.  Bitter taste in the mouth.  Difficulty swallowing.  Difficulty opening the mouth.  Nausea.  Vomiting.  Chills.  Swollen neck glands. DIAGNOSIS   A medical and dental history will be taken.  An examination will be performed by tapping on the abscessed tooth.  X-rays may be taken of the tooth to identify the abscess. TREATMENT The goal of treatment is to eliminate the infection. You may be prescribed antibiotic medicine to stop the infection from spreading. A root canal may be performed to save the tooth. If the tooth cannot be saved, it may be pulled (extracted) and the abscess may be drained.  HOME CARE INSTRUCTIONS  Only take over-the-counter or prescription medicines for pain, fever, or discomfort as directed by your caregiver.  Rinse your mouth (gargle) often with salt water ( tsp salt in 8 oz [250 ml] of warm water) to relieve pain or swelling.  Do not drive after taking pain medicine (narcotics).  Do not apply heat to the outside of your face.  Return to your dentist for  further treatment as directed. SEEK MEDICAL CARE IF:  Your pain is not helped by medicine.  Your pain is getting worse instead of better. SEEK IMMEDIATE MEDICAL CARE IF:  You have a fever or persistent symptoms for more than 2-3 days.  You have a fever and your symptoms suddenly get worse.  You have chills or a very bad headache.  You have problems breathing or swallowing.  You have trouble opening your mouth.  You have swelling in the neck or around the eye. Document Released: 05/06/2005 Document Revised: 01/29/2012 Document Reviewed: 08/14/2010 [facility] CenterExitCare Patient Information 2015 [place], [place]. This information is not intended to replace advice given to you by your health care provider. Make sure you discuss any questions you have with your health care provider.    Dental Caries Dental caries (also called tooth decay) is the most common oral disease. It can occur at any age but is more common in children and young adults.  HOW DENTAL CARIES DEVELOPS  The process of decay begins when bacteria and foods (particularly sugars and starches) combine in your mouth to produce plaque. Plaque is a substance that sticks to the hard, outer surface of a tooth (enamel). The bacteria in plaque produce acids that attack enamel. These acids may also attack the root surface of a tooth (cementum) if it is exposed. Repeated attacks dissolve these surfaces and create holes in the tooth (cavities). If left  untreated, the acids destroy the other layers of the tooth.  RISK FACTORS  Frequent sipping of sugary beverages.   Frequent snacking on sugary and starchy foods, especially those that easily get stuck in the teeth.   Poor oral hygiene.   Dry mouth.   Substance abuse such as methamphetamine abuse.   Broken or poor-fitting dental restorations.   Eating disorders.   Gastroesophageal reflux disease (GERD).   Certain radiation treatments to the head and neck. SYMPTOMS In the early  stages of dental caries, symptoms are seldom present. Sometimes white, chalky areas may be seen on the enamel or other tooth layers. In later stages, symptoms may include:  Pits and holes on the enamel.  Toothache after sweet, hot, or cold foods or drinks are consumed.  Pain around the tooth.  Swelling around the tooth. DIAGNOSIS  Most of the time, dental caries is detected during a regular dental checkup. A diagnosis is made after a thorough medical and dental history is taken and the surfaces of your teeth are checked for signs of dental caries. Sometimes special instruments, such as lasers, are used to check for dental caries. Dental X-ray exams may be taken so that areas not visible to the eye (such as between the contact areas of the teeth) can be checked for cavities.  TREATMENT  If dental caries is in its early stages, it may be reversed with a fluoride treatment or an application of a remineralizing agent at the dental office. Thorough brushing and flossing at home is needed to aid these treatments. If it is in its later stages, treatment depends on the location and extent of tooth destruction:   If a small area of the tooth has been destroyed, the destroyed area will be removed and cavities will be filled with a material such as gold, silver amalgam, or composite resin.   If a large area of the tooth has been destroyed, the destroyed area will be removed and a cap (crown) will be fitted over the remaining tooth structure.   If the center part of the tooth (pulp) is affected, a procedure called a root canal will be needed before a filling or crown can be placed.   If most of the tooth has been destroyed, the tooth may need to be pulled (extracted). HOME CARE INSTRUCTIONS You can prevent, stop, or reverse dental caries at home by practicing good oral hygiene. Good oral hygiene includes:  Thoroughly cleaning your teeth at least twice a day with a toothbrush and dental floss.    Using a fluoride toothpaste. A fluoride mouth rinse may also be used if recommended by your dentist or health care provider.   Restricting the amount of sugary and starchy foods and sugary liquids you consume.   Avoiding frequent snacking on these foods and sipping of these liquids.   Keeping regular visits with a dentist for checkups and cleanings. PREVENTION   Practice good oral hygiene.  Consider a dental sealant. A dental sealant is a coating material that is applied by your dentist to the pits and grooves of teeth. The sealant prevents food from being trapped in them. It may protect the teeth for several years.  Ask about fluoride supplements if you live in a community without fluorinated water or with water that has a low fluoride content. Use fluoride supplements as directed by your dentist or health care provider.  Allow fluoride varnish applications to teeth if directed by your dentist or health care provider. Document Released: 01/26/2002  Document Revised: 09/20/2013 Document Reviewed: 05/08/2012 [facility]ExitCare Patient Information 2015 [place]Care, [place]LL[place]. This information is not intended to replace advice given to you by your health care provider. Make sure you discuss any questions you have with your health care provider.    Dental Care and Dentist Visits Dental care supports good overall health. Regular dental visits can also help you avoid dental pain, bleeding, infection, and other more serious health problems in the future. It is important to keep the mouth healthy because diseases in the teeth, gums, and other oral tissues can spread to other areas of the body. Some problems, such as diabetes, heart disease, and pre-term labor have been associated with poor oral health.  See your dentist every 6 months. If you experience emergency problems such as a toothache or broken tooth, go to the dentist right away. If you see your dentist regularly, you may catch problems early. It is  easier to be treated for problems in the early stages.  WHAT TO EXPECT AT A DENTIST VISIT  Your dentist will look for many common oral health problems and recommend proper treatment. At your regular dental visit, you can expect:  Gentle cleaning of the teeth and gums. This includes scraping and polishing. This helps to remove the sticky substance around the teeth and gums (plaque). Plaque forms in the mouth shortly after eating. Over time, plaque hardens on the teeth as tartar. If tartar is not removed regularly, it can cause problems. Cleaning also helps remove stains.  Periodic X-rays. These pictures of the teeth and supporting bone will help your dentist assess the health of your teeth.  Periodic fluoride treatments. Fluoride is a natural mineral shown to help strengthen teeth. Fluoride treatmentinvolves applying a fluoride gel or varnish to the teeth. It is most commonly done in children.  Examination of the mouth, tongue, jaws, teeth, and gums to look for any oral health problems, such as:  Cavities (dental caries). This is decay on the tooth caused by plaque, sugar, and acid in the mouth. It is best to catch a cavity when it is small.  Inflammation of the gums caused by plaque buildup (gingivitis).  Problems with the mouth or malformed or misaligned teeth.  Oral cancer or other diseases of the soft tissues or jaws. KEEP YOUR TEETH AND GUMS HEALTHY For healthy teeth and gums, follow these general guidelines as well as your dentist's specific advice:  Have your teeth professionally cleaned at the dentist every 6 months.  Brush twice daily with a fluoride toothpaste.  Floss your teeth daily.  Ask your dentist if you need fluoride supplements, treatments, or fluoride toothpaste.  Eat a healthy diet. Reduce foods and drinks with added sugar.  Avoid smoking. TREATMENT FOR ORAL HEALTH PROBLEMS If you have oral health problems, treatment varies depending on the conditions present  in your teeth and gums.  Your caregiver will most likely recommend good oral hygiene at each visit.  For cavities, gingivitis, or other oral health disease, your caregiver will perform a procedure to treat the problem. This is typically done at a separate appointment. Sometimes your caregiver will refer you to another dental specialist for specific tooth problems or for surgery. SEEK IMMEDIATE DENTAL CARE IF:  You have pain, bleeding, or soreness in the gum, tooth, jaw, or mouth area.  A permanent tooth becomes loose or separated from the gum socket.  You experience a blow or injury to the mouth or jaw area. Document Released: 01/16/2011 Document Revised: 07/29/2011 Document Reviewed:  01/16/2011 °ExitCare® Patient Information ©2015 [employer], LLC. This information is not intended to replace advice given to you by your health care provider. Make sure you discuss any questions you have with your health care provider. ° ° °

## 2014-05-16 NOTE — ED Provider Notes (Signed)
CSN: [id number]     Arrival date & time 05/16/14  1134 History   First MD Initiated Contact with Patient 05/16/14 1145     Chief Complaint  Patient presents with  . Dental Pain     (Consider location/radiation/quality/duration/timing/severity/associated sxs/prior Treatment) Patient is a 38 y.o. female presenting with tooth pain. The history is provided by the patient.  Dental Pain Associated symptoms: no fever, no headaches and no neck pain   pt c/o anterior left lower dental pain for the past 4-5 days. Pain constant, dull, moderate, non radiating, worse w eating. Has no local dentist. Denies fever or chills. No sore throat. No trouble breathing or swallowing. No neck pain or swelling.     History reviewed. No pertinent past medical history. Past Surgical History  Procedure Laterality Date  . Ectopic pregnancy surgery     History reviewed. No pertinent family history. History  Substance Use Topics  . Smoking status: Current Every Day Smoker  . Smokeless tobacco: Not on file  . Alcohol Use: Yes     Comment: occasional   OB History    No data available     Review of Systems  Constitutional: Negative for fever and chills.  HENT: Negative for sore throat.   Gastrointestinal: Negative for nausea and vomiting.  Musculoskeletal: Negative for neck pain.  Neurological: Negative for headaches.      Allergies  Codeine  Home Medications   Prior to Admission medications   Medication Sig Start Date End Date Taking? Authorizing Provider  amoxicillin (AMOXIL) 500 MG capsule Take 1 capsule (500 mg total) by mouth 3 (three) times daily. 05/16/14   Suzi RootsKevin E Edwena Mayorga, MD  ibuprofen (ADVIL,MOTRIN) 200 MG tablet Take 800 mg by mouth every 6 (six) hours as needed.    Historical Provider, MD  naproxen sodium (ANAPROX) 220 MG tablet Take 660 mg by mouth 2 (two) times daily as needed (pain).    Historical Provider, MD   BP 134/89 mmHg  Pulse 81  Temp(Src) 98.5 F (36.9 C) (Oral)  Resp  18  SpO2 100%  LMP 04/20/2014 Physical Exam  Constitutional: She appears well-developed and well-nourished. No distress.  HENT:  Mouth/Throat: Oropharynx is clear and moist.  Tooth 24 with decay, associated gum swelling and tenderness. No facial swelling or erythema. No trismus. No pain, swelling, or tenderness to floor of mouth or neck.   Eyes: Conjunctivae are normal. No scleral icterus.  Neck: Neck supple. No tracheal deviation present.  Cardiovascular: Normal rate.   Pulmonary/Chest: Effort normal. No respiratory distress.  Abdominal: Normal appearance.  Musculoskeletal: She exhibits no edema.  Lymphadenopathy:    She has no cervical adenopathy.  Neurological: She is alert.  Skin: Skin is warm and dry. No rash noted. She is not diaphoretic.  Psychiatric: She has a normal mood and affect.  Nursing note and vitals reviewed.   ED Course  Procedures (including critical care time) Labs Review   MDM   Reviewed nursing notes and prior charts for additional history.   Confirmed nkda x codeine.    Pt declines pain med, states she is fine with taking her naprosyn.    Will rx antibiotic for suspected dental abscess and provide dental referral as pt has no primary dentist.    Suzi RootsKevin E Anzel Kearse, MD 05/16/14 1154

## 2014-07-22 ENCOUNTER — Emergency Department (HOSPITAL_COMMUNITY)
Admission: EM | Admit: 2014-07-22 | Discharge: 2014-07-22 | Disposition: A | Discharge status: Home / Self Care | Payer: Self-pay | Attending: Emergency Medicine | Admitting: Emergency Medicine

## 2014-07-22 ENCOUNTER — Encounter (HOSPITAL_COMMUNITY): Payer: Self-pay | Admitting: Emergency Medicine

## 2014-07-22 DIAGNOSIS — Z792 Long term (current) use of antibiotics: Secondary | ICD-10-CM | POA: Insufficient documentation

## 2014-07-22 DIAGNOSIS — R220 Localized swelling, mass and lump, head: Secondary | ICD-10-CM

## 2014-07-22 DIAGNOSIS — K047 Periapical abscess without sinus: Secondary | ICD-10-CM | POA: Insufficient documentation

## 2014-07-22 DIAGNOSIS — K029 Dental caries, unspecified: Secondary | ICD-10-CM | POA: Insufficient documentation

## 2014-07-22 DIAGNOSIS — K002 Abnormalities of size and form of teeth: Secondary | ICD-10-CM | POA: Insufficient documentation

## 2014-07-22 DIAGNOSIS — Z720 Tobacco use: Secondary | ICD-10-CM | POA: Insufficient documentation

## 2014-07-22 MED ORDER — IBUPROFEN 600 MG PO TABS: 600.0000 mg | ORAL_TABLET | Freq: Four times a day (QID) | ORAL | Status: DC | PRN

## 2014-07-22 MED ORDER — LIDOCAINE-EPINEPHRINE (PF) 2 %-1:200000 IJ SOLN
10.0000 mL | Freq: Once | INTRAMUSCULAR | Status: DC
  Filled 2014-07-22: qty 20

## 2014-07-22 MED ORDER — CLINDAMYCIN HCL 150 MG PO CAPS: 150.0000 mg | ORAL_CAPSULE | Freq: Four times a day (QID) | ORAL | Status: DC

## 2014-07-22 NOTE — ED Provider Notes (Signed)
CSN: [id number]     Arrival date & time 07/22/14  0806 History   First MD Initiated Contact with Patient 07/22/14 [phone]     Chief Complaint  Patient presents with  . Abscess     (Consider location/radiation/quality/duration/timing/severity/associated sxs/prior Treatment) HPI  Pt is a 39yo female presenting to ED with c/o waking this morning with a "knot" on her chin.  Pt c/o sore, tight sensation 5/10 to her chin.  States the area feels like it "is going to explode."  Denies fever, chills, n/v/d. No difficulty breathing or swallowing. States she was seen in December 2015 for a dental abscess, given amoxicillin. Swelling did go away but is back in same area. Denies ever f/u with a dentist as symptoms had resolved.   History reviewed. No pertinent past medical history. Past Surgical History  Procedure Laterality Date  . Ectopic pregnancy surgery     History reviewed. No pertinent family history. History  Substance Use Topics  . Smoking status: Current Every Day Smoker  . Smokeless tobacco: Never Used  . Alcohol Use: Yes     Comment: occasional   OB History    No data available     Review of Systems  Constitutional: Negative for fever and chills.  HENT: Positive for dental problem and facial swelling ( chin). Negative for trouble swallowing and voice change.   Gastrointestinal: Negative for nausea, vomiting and abdominal pain.      Allergies  Codeine  Home Medications   Prior to Admission medications   Medication Sig Start Date End Date Taking? Authorizing Provider  amoxicillin (AMOXIL) 500 MG capsule Take 1 capsule (500 mg total) by mouth 3 (three) times daily. 05/16/14   Suzi Roots, MD  clindamycin (CLEOCIN) 150 MG capsule Take 1 capsule (150 mg total) by mouth every 6 (six) hours. 07/22/14   Junius Finner, PA-C  ibuprofen (ADVIL,MOTRIN) 200 MG tablet Take 800 mg by mouth every 6 (six) hours as needed.    Historical Provider, MD  ibuprofen (ADVIL,MOTRIN) 600 MG tablet  Take 1 tablet (600 mg total) by mouth every 6 (six) hours as needed. 07/22/14   Junius Finner, PA-C  naproxen sodium (ANAPROX) 220 MG tablet Take 660 mg by mouth 2 (two) times daily as needed (pain).    Historical Provider, MD   BP 133/87 mmHg  Pulse 76  Temp(Src) 98.9 F (37.2 C) (Oral)  Resp 18  SpO2 96% Physical Exam  Constitutional: She is oriented to person, place, and time. She appears well-developed and well-nourished.  HENT:  Head: Normocephalic.    Nose: Nose normal. Right sinus exhibits no maxillary sinus tenderness and no frontal sinus tenderness. Left sinus exhibits no maxillary sinus tenderness and no frontal sinus tenderness.  Mouth/Throat: Uvula is midline, oropharynx is clear and moist and mucous membranes are normal. No trismus in the jaw. Abnormal dentition. Dental abscesses and dental caries present. No uvula swelling.    Dental decay of tooth #24. Mild gingival edema with tenderness. Mild to moderate edema to chin, worse on left side. No erythema, warmth, or ecchymosis. Area tender to touch.   Eyes: EOM are normal.  Neck: Normal range of motion.  Cardiovascular: Normal rate.   Pulmonary/Chest: Effort normal.  Musculoskeletal: Normal range of motion.  Neurological: She is alert and oriented to person, place, and time.  Skin: Skin is warm and dry. No erythema.  Psychiatric: She has a normal mood and affect. Her behavior is normal.  Nursing note and vitals reviewed.  ED Course  Procedures (including critical care time) Labs Review Labs Reviewed - No data to display  Imaging Review No results found.   EKG Interpretation None      MDM   Final diagnoses:  Dental abscess  Facial swelling    Pt presenting to ED with swelling of her chin. Hx of similar symptoms in Dec. tx for a dental abscess with amoxicillin.  On exam, pt has dental decay of tooth #24, swelling of chin without erythema over skin. Will tx with clindamycin. Advised pt to call to schedule  f/u appointment with Dr. Russella DarBenitez, DDS, for further evaluation and treatment of dental abscess. [employer] resource guide and home care instructions also provided. Return precautions provided. Pt verbalized understanding and agreement with tx plan.    Junius Finnerrin O'Malley, PA-C 07/22/14 1702  Doug SouSam Jacubowitz, MD 07/25/14 [phone]

## 2014-07-22 NOTE — Discharge Instructions (Signed)
Emergency Department Resource Guide °1) Find a Doctor and Pay Out of Pocket °Although you won't have to find out who is covered by your insurance plan, it is a good idea to ask around and get recommendations. You will then need to call the office and see if the doctor you have chosen will accept you as a new patient and what types of options they offer for patients who are self-pay. Some doctors offer discounts or will set up payment plans for their patients who do not have insurance, but you will need to ask so you aren't surprised when you get to your appointment. ° °2) Contact Your Local Health Department °Not all health departments have doctors that can see patients for sick visits, but many do, so it is worth a call to see if yours does. If you don't know where your local health department is, you can check in your phone book. The CDC also has a tool to help you locate your state's health department, and many state websites also have listings of all of their local health departments. ° °3) Find a Walk-in Clinic °If your illness is not likely to be very severe or complicated, you may want to try a walk in clinic. These are popping up all over the country in pharmacies, drugstores, and shopping centers. They're usually staffed by nurse practitioners or physician assistants that have been trained to treat common illnesses and complaints. They're usually fairly quick and inexpensive. However, if you have serious medical issues or chronic medical problems, these are probably not your best option. ° °No Primary Care Doctor: °- Call [facility] at  832-8000 - they can help you locate a primary care doctor that  accepts your insurance, provides certain services, etc. °- Physician Referral Service- 1-800-533-3463 ° °Chronic Pain Problems: °Organization         Address  Phone   Notes  °[facility]  ([phone]) [phone] Patients need to be referred by their primary care doctor.  ° °Medication  Assistance: °Organization         Address  Phone   Notes  °[place] [facility] [address] °[place], [place] [address] (336) 641-8030 --Must be a resident of [place] °-- Must have NO insurance coverage whatsoever (no Medicaid/ Medicare, etc.) °-- The pt. MUST have a primary care doctor that directs their care regularly and follows them in the community °  °[facility]  (866) 331-1348   °[employer]  (888) 892-1162   ° °Agencies that provide inexpensive medical care: °Organization         Address                                                       Phone                                                                            Notes  °[facility]  (336) 832-8035   °[facility]    (336)   832-7272   °[facility] [address] °[place], [place] [address] (336) 832-4777   °[facility] [address], °[place] (336) 271-4999   °[facility]    (336) 373-0678   °[facility]    (336) 272-1050   °[facility] ° [address], [place] Phone:  (336) 832-4444, Fax:  (336) 832-4440 Hours of Operation:  9 am - 6 pm, M-F.  Also accepts Medicaid/Medicare and self-pay.  °[facility] ° [address], [place] Phone: (336) [phone], Fax: (336) [phone]. Hours of Operation:  8:30 am - 5:30 pm, M-F.  Also accepts Medicaid and self-pay.  °[facility] [place] [address], [place] Phone: (336) 878-6027   °[facility] [address], [place], [place] (336)723-1848, Ext. 123 Mondays & Thursdays: 7-9 AM.  First 15 patients are seen on a first come, first serve basis. °  ° °Medicaid-accepting [place] Providers: ° °Organization         Address                                                                       Phone                               Notes  °[facility] [address], [place] (336) [phone] Also accepts self-pay patients.  °[facility] [address], [place] ° (336) [phone]   °[facility] [address], [place] (336) 288-8857   °[facility] [address] [place] [address], [place] (336) 299-7000   °[facility] [address], [place]  ° (336) 373-1557 Only accepts [employer] Medicaid patients after they have their name applied to their card.  ° °Self-Pay (no insurance) in [place]: °  °Organization         Address                                                     Phone               Notes  °Sickle Cell Patients, [facility] [address], [place] (336) [phone]   °[facility] [address], [place] (336) 832-4400   °[facility] ° [address], [place] (336) [phone]   °[facility]/Dr. Osei-Bonsu ° [address] [place] [address], [place] or [address], [place] (336) [phone] Phone number for both [place] and [place] locations is the same.  °[facility] [address], [place] (336) [phone]   °[facility] [address] [place] [address], [place] or [address] (336) 852-7530 °(336) 878-2260   °[facility] [address], [place] (336) 350-1642, phone; (336) 294-5005, fax Sees patients 1st and 3rd Saturday of   every month.  Must not qualify for public or private insurance (i.e. Medicaid, Medicare, [place] Health Choice, Veterans' Benefits) • Household income should be no more than 200% of the poverty level •The clinic cannot treat you if you are pregnant or think you are pregnant • Sexually transmitted diseases are not treated at the clinic.  ° °_____________Dental Care:______________ °Organization         Address                                  Phone                       Notes  °[place]  [facility] [address], [place] (336) 641-6152 Accepts children up to age 21 who are enrolled in Medicaid or [place] Health Choice; pregnant women with a Medicaid card; and children who have applied for Medicaid or [place] Health Choice, but were declined, whose parents can pay a reduced fee at time of service.  °[place] [place]  [address], [place] (336) 641-7733 Accepts children up to age 21 who are enrolled in Medicaid or [place] Health Choice; pregnant women with a Medicaid card; and children who have applied for Medicaid or [place] Health Choice, but were declined, whose parents can pay a reduced fee at time of service.  °[facility] ° [address], [place] (336) 641-4533 Patients are seen by appointment only. Walk-ins are not accepted. [facility] will see patients 18 years of age and older. °Monday - Tuesday (8am-5pm) °Most Wednesdays (8:30-5pm) °$30 per visit, cash only  °[facility] ° [address], [place] (336) 641-4533 Patients are seen by appointment only. Walk-ins are not accepted. [facility] will see patients 18 years of age and older. °One Wednesday Evening (Monthly: Volunteer Based).  $30 per visit, cash only  °[facility]  (919) 537-3737 for adults; Children under age 4, call Graduate Pediatric Dentistry at ([phone]) [phone]. Children aged 4-14, please call ([phone]) [phone] to request a pediatric application. ° Dental services are provided in all areas of dental care including fillings, crowns and bridges, complete and partial dentures, implants, gum treatment, root canals, and extractions. Preventive care is also provided. Treatment is provided to both adults and children. °Patients are selected via a lottery and there is often a waiting list. °  °[facility] [address], °[place] ° (336) 763-8833  [email or web address] °  °[facility] [address], [place], [place] (336)723-1848, Ext. 123 Second and Fourth Thursday of each month, opens at 6:30 AM; Clinic ends at 9 AM.  Patients are seen on a first-come first-served basis, and a limited number are seen during each clinic.  ° °[facility] ° [address], [place], [place] (336) [phone]   Eligibility Requirements °You must have lived in [place], [place], or [place] counties for at least the last three months. °  You cannot be eligible for state or federal sponsored healthcare insurance, including Veterans Administration, Medicaid, or Medicare. °  You generally cannot be eligible for healthcare insurance through your employer.  °  How to apply: °Eligibility screenings are held every Tuesday and Wednesday afternoon from 1:00 pm until 4:00 pm. You do not need an appointment for the interview!  °  [facility] [address], [place],  [phone]   °[facility]  336-342-8273   °[facility]  [phone]   °[facility]  [phone]   ° °

## 2014-07-22 NOTE — ED Notes (Signed)
Pt sts that she woke this am and noted a "knot" on her chin. No known cause. No drainage noted. Pt feels the area "is going to explode".

## 2014-09-15 ENCOUNTER — Encounter (HOSPITAL_COMMUNITY): Payer: Self-pay | Admitting: Emergency Medicine

## 2014-09-15 ENCOUNTER — Emergency Department (INDEPENDENT_AMBULATORY_CARE_PROVIDER_SITE_OTHER)
Admission: EM | Admit: 2014-09-15 | Discharge: 2014-09-15 | Disposition: A | Discharge status: Home / Self Care | Payer: Self-pay | Source: Home / Self Care | Attending: Emergency Medicine | Admitting: Emergency Medicine

## 2014-09-15 DIAGNOSIS — N390 Urinary tract infection, site not specified: Secondary | ICD-10-CM

## 2014-09-15 LAB — POCT I-STAT, CHEM 8
BUN: 11 mg/dL (ref 6–23)
CREATININE: 0.8 mg/dL (ref 0.50–1.10)
Calcium, Ion: 1.21 mmol/L (ref 1.12–1.23)
Chloride: 106 mmol/L (ref 96–112)
GLUCOSE: 89 mg/dL (ref 70–99)
HEMATOCRIT: 42 % (ref 36.0–46.0)
HEMOGLOBIN: 14.3 g/dL (ref 12.0–15.0)
POTASSIUM: 3.5 mmol/L (ref 3.5–5.1)
SODIUM: 140 mmol/L (ref 135–145)
TCO2: 21 mmol/L (ref 0–100)

## 2014-09-15 LAB — POCT URINALYSIS DIP (DEVICE)
Bilirubin Urine: NEGATIVE
GLUCOSE, UA: NEGATIVE mg/dL
Ketones, ur: NEGATIVE mg/dL
Nitrite: POSITIVE — AB
PROTEIN: 100 mg/dL — AB
SPECIFIC GRAVITY, URINE: 1.015 (ref 1.005–1.030)
UROBILINOGEN UA: 0.2 mg/dL (ref 0.0–1.0)
pH: 6 (ref 5.0–8.0)

## 2014-09-15 MED ORDER — CIPROFLOXACIN HCL 500 MG PO TABS: 500.0000 mg | ORAL_TABLET | Freq: Two times a day (BID) | ORAL | Status: DC

## 2014-09-15 MED ORDER — CEFTRIAXONE SODIUM 1 G IJ SOLR
1.0000 g | Freq: Once | INTRAMUSCULAR | Status: AC
  Administered 2014-09-15: 1 g via INTRAMUSCULAR

## 2014-09-15 MED ORDER — CEFTRIAXONE SODIUM 1 G IJ SOLR
INTRAMUSCULAR | Status: AC
  Filled 2014-09-15: qty 10

## 2014-09-15 MED ORDER — LIDOCAINE HCL (PF) 1 % IJ SOLN
INTRAMUSCULAR | Status: AC
  Filled 2014-09-15: qty 5

## 2014-09-15 NOTE — ED Notes (Signed)
See provider's note

## 2014-09-15 NOTE — ED Provider Notes (Signed)
CSN: [id number]     Arrival date & time 09/15/14  1805 History   First MD Initiated Contact with Patient 09/15/14 1941     Chief Complaint  Patient presents with  . Back Pain   (Consider location/radiation/quality/duration/timing/severity/associated sxs/prior Treatment) HPI      39 year old female presents complaining of right-sided flank pain. This started earlier today will she was at work. She denies any injury or any inciting incident. She also admits to some increased urinary frequency over the past few days. She denies hematuria, dysuria, fever, chills, NVD. She has a history of a UTI many years ago. She denies any vaginal bleeding or discharge. Denies any recent hospitalizations or catheterizations  No past medical history on file. Past Surgical History  Procedure Laterality Date  . Ectopic pregnancy surgery     No family history on file. History  Substance Use Topics  . Smoking status: Current Every Day Smoker  . Smokeless tobacco: Never Used  . Alcohol Use: Yes     Comment: occasional   OB History    No data available     Review of Systems  Genitourinary: Positive for frequency and flank pain. Negative for dysuria and urgency.  All other systems reviewed and are negative.   Allergies  Codeine  Home Medications   Prior to Admission medications   Medication Sig Start Date End Date Taking? Authorizing Provider  amoxicillin (AMOXIL) 500 MG capsule Take 1 capsule (500 mg total) by mouth 3 (three) times daily. 05/16/14   Cathren Laine, MD  ciprofloxacin (CIPRO) 500 MG tablet Take 1 tablet (500 mg total) by mouth every 12 (twelve) hours. 09/15/14   Graylon Good, PA-C  clindamycin (CLEOCIN) 150 MG capsule Take 1 capsule (150 mg total) by mouth every 6 (six) hours. 07/22/14   Junius Finner, PA-C  ibuprofen (ADVIL,MOTRIN) 200 MG tablet Take 800 mg by mouth every 6 (six) hours as needed.    Historical Provider, MD  ibuprofen (ADVIL,MOTRIN) 600 MG tablet Take 1 tablet (600 mg  total) by mouth every 6 (six) hours as needed. 07/22/14   Junius Finner, PA-C  naproxen sodium (ANAPROX) 220 MG tablet Take 660 mg by mouth 2 (two) times daily as needed (pain).    Historical Provider, MD   BP 140/103 mmHg  Pulse 74  Temp(Src) 99.3 F (37.4 C) (Oral)  Resp 16  SpO2 99% Physical Exam  Constitutional: She is oriented to person, place, and time. Vital signs are normal. She appears well-developed and well-nourished. No distress.  HENT:  Head: Normocephalic and atraumatic.  Pulmonary/Chest: Effort normal. No respiratory distress.  Abdominal: Soft. Bowel sounds are normal. She exhibits no distension and no mass. There is no hepatosplenomegaly. There is no tenderness. There is no rebound, no guarding and no CVA tenderness.  Neurological: She is alert and oriented to person, place, and time. She has normal strength. Coordination normal.  Skin: Skin is warm and dry. No rash noted. She is not diaphoretic.  Psychiatric: She has a normal mood and affect. Judgment normal.  Nursing note and vitals reviewed.   ED Course  Procedures (including critical care time) Labs Review Labs Reviewed  POCT URINALYSIS DIP (DEVICE) - Abnormal; Notable for the following:    Hgb urine dipstick MODERATE (*)    Protein, ur 100 (*)    Nitrite POSITIVE (*)    Leukocytes, UA SMALL (*)    All other components within normal limits  URINE CULTURE  POCT I-STAT, CHEM 8    Imaging  Review No results found.   MDM   1. UTI (lower urinary tract infection)    UTI, possible early pyelonephritis. Treat as pyleo.  Urine culture sent.  F/u PRN   Meds ordered this encounter  Medications  . cefTRIAXone (ROCEPHIN) injection 1 g    Sig:   . ciprofloxacin (CIPRO) 500 MG tablet    Sig: Take 1 tablet (500 mg total) by mouth every 12 (twelve) hours.    Dispense:  20 tablet    Refill:  0       Graylon GoodZachary H Marliyah Reid, PA-C 09/15/14 2028

## 2014-09-15 NOTE — Discharge Instructions (Signed)

## 2014-09-18 LAB — URINE CULTURE: Colony Count: >=100000

## 2014-09-19 ENCOUNTER — Telehealth (HOSPITAL_COMMUNITY): Payer: Self-pay

## 2014-09-19 NOTE — ED Notes (Signed)
Urine culture:  >100,000 colonies E. Coli  Pt. adequately treated with Cipro. Julie Smith M 09/19/2014  

## 2014-09-19 NOTE — Telephone Encounter (Signed)
Post ED Visit - Positive Culture Follow-up  Culture report reviewed by antimicrobial stewardship pharmacist: []  Wes Dulaney, Pharm.D., BCPS [x]  Celedonio MiyamotoJeremy Frens, Pharm.D., BCPS []  Georgina PillionElizabeth Martin, Pharm.D., BCPS []  WorleyMinh Pham, VermontPharm.D., BCPS, AAHIVP []  Estella HuskMichelle Turner, Pharm.D., BCPS, AAHIVP []  Elder CyphersLorie Poole, [address]Pharm[address]D., BCPS  Positive Urine culture, 100,000 colonies -> E Coli Treated with Ciprofloxacin, organism sensitive to the same and no further patient follow-up is required at this time.  Arvid RightClark, Virdell Hoiland Dorn 09/19/2014, 6:36 PM

## 2015-06-29 ENCOUNTER — Emergency Department (HOSPITAL_COMMUNITY)
Admission: EM | Admit: 2015-06-29 | Discharge: 2015-06-29 | Disposition: A | Discharge status: Home / Self Care | Payer: Self-pay | Attending: Emergency Medicine | Admitting: Emergency Medicine

## 2015-06-29 ENCOUNTER — Encounter (HOSPITAL_COMMUNITY): Payer: Self-pay | Admitting: Emergency Medicine

## 2015-06-29 DIAGNOSIS — Z792 Long term (current) use of antibiotics: Secondary | ICD-10-CM | POA: Insufficient documentation

## 2015-06-29 DIAGNOSIS — F17200 Nicotine dependence, unspecified, uncomplicated: Secondary | ICD-10-CM | POA: Insufficient documentation

## 2015-06-29 DIAGNOSIS — J029 Acute pharyngitis, unspecified: Secondary | ICD-10-CM | POA: Insufficient documentation

## 2015-06-29 LAB — RAPID STREP SCREEN (MED CTR MEBANE ONLY): Streptococcus, Group A Screen (Direct): NEGATIVE

## 2015-06-29 NOTE — ED Provider Notes (Signed)
CSN: [id number]     Arrival date & time 06/29/15  1004 History   First MD Initiated Contact with Patient 06/29/15 1007     Chief Complaint  Patient presents with  . Sore Throat    18 hour hx of sore throat     (Consider location/radiation/quality/duration/timing/severity/associated sxs/prior Treatment) HPI   Pt is a 40 year-old female, presents to the ER for evaluation of sore throat that began last night.  Her pain is worse on the left side of her throat with some radiation of pain to her left ear, worse when swallowing.  With swallowing pain is rated 6/10, currently 0/10 when not swallowing.  She has tried ibuprofen with minimal relief.  She denies associated fever, chills, sweats, nausea, abdominal pain, runny nose, stuffy nose, post-nasal drip, cough.  She has possible sick contacts at work.  History reviewed. No pertinent past medical history. Past Surgical History  Procedure Laterality Date  . Ectopic pregnancy surgery    . Skin biopsy     History reviewed. No pertinent family history. Social History  Substance Use Topics  . Smoking status: Current Every Day Smoker  . Smokeless tobacco: Never Used  . Alcohol Use: Yes     Comment: occasional   OB History    No data available     Review of Systems  Gastrointestinal: Negative.   Genitourinary: Negative.   Musculoskeletal: Negative.  Negative for neck pain and neck stiffness.  Skin: Negative.  Negative for color change and rash.  Neurological: Negative for headaches.  All other systems reviewed and are negative.     Allergies  Codeine  Home Medications   Prior to Admission medications   Medication Sig Start Date End Date Taking? Authorizing Provider  amoxicillin (AMOXIL) 500 MG capsule Take 1 capsule (500 mg total) by mouth 3 (three) times daily. 05/16/14   Cathren Laine, MD  ciprofloxacin (CIPRO) 500 MG tablet Take 1 tablet (500 mg total) by mouth every 12 (twelve) hours. 09/15/14   Graylon Good, PA-C   clindamycin (CLEOCIN) 150 MG capsule Take 1 capsule (150 mg total) by mouth every 6 (six) hours. 07/22/14   Junius Finner, PA-C  ibuprofen (ADVIL,MOTRIN) 200 MG tablet Take 800 mg by mouth every 6 (six) hours as needed.    Historical Provider, MD  ibuprofen (ADVIL,MOTRIN) 600 MG tablet Take 1 tablet (600 mg total) by mouth every 6 (six) hours as needed. 07/22/14   Junius Finner, PA-C  naproxen sodium (ANAPROX) 220 MG tablet Take 660 mg by mouth 2 (two) times daily as needed (pain).    Historical Provider, MD   BP 130/84 mmHg  Pulse 72  Temp(Src) 98.5 F (36.9 C) (Oral)  Resp 18  SpO2 100%  LMP 06/21/2015 (Exact Date) Physical Exam  Constitutional: She is oriented to person, place, and time. She appears well-developed and well-nourished. No distress.  HENT:  Head: Normocephalic and atraumatic.  Nose: Nose normal.  Mouth/Throat: Oropharynx is clear and moist. No oropharyngeal exudate.  Mild posterior oropharynx erythema, lymphatic tissue to left OP 2+, right side not visible, uvula midline   Eyes: Conjunctivae and EOM are normal. Pupils are equal, round, and reactive to light. Right eye exhibits no discharge. Left eye exhibits no discharge. No scleral icterus.  Neck: Normal range of motion. Neck supple. No JVD present. No tracheal deviation present. No thyromegaly present.  Left anterior cervical lymphadenopathy  Cardiovascular: Normal rate, regular rhythm, normal heart sounds and intact distal pulses.  Exam reveals no gallop  and no friction rub.   No murmur heard. Pulmonary/Chest: Effort normal and breath sounds normal. No respiratory distress. She has no wheezes. She has no rales. She exhibits no tenderness.  Abdominal: Soft. Bowel sounds are normal. She exhibits no distension and no mass. There is no tenderness. There is no rebound and no guarding.  Musculoskeletal: Normal range of motion. She exhibits no edema or tenderness.  Lymphadenopathy:    She has cervical adenopathy.   Neurological: She is alert and oriented to person, place, and time. She has normal reflexes. No cranial nerve deficit. She exhibits normal muscle tone. Coordination normal.  Skin: Skin is warm and dry. No rash noted. She is not diaphoretic. No erythema. No pallor.  Psychiatric: She has a normal mood and affect. Her behavior is normal. Judgment and thought content normal.  Nursing note and vitals reviewed.   ED Course  Procedures (including critical care time) Labs Review Labs Reviewed  RAPID STREP SCREEN (NOT AT [facility])  CULTURE, GROUP A STREP [facility])    Imaging Review No results found. I have personally reviewed and evaluated these images and lab results as part of my medical decision-making.   EKG Interpretation None      MDM   Pt with ST that began last night.  She denies associated URI sx, no fever, pt is well appearing, with normal vital signs.  Will test with rapid strep with low Centor Score.  No concern for PTA, pt able to speak, breath and clear secretions w/o difficulty.    Rapid Strep negative, will d/c with dx of pharyngitis, likely viral, supportive tx indicated and discussed with the pt.  She understands that culture report will come back in 2-3 days and she will be contacted if she needs abx.  Return precautions reviewed.  She was discharged in good condition, VSS.  Filed Vitals:   06/29/15 1013 06/29/15 1131  BP: 130/84   Pulse: 81 72  Temp: 98.5 F (36.9 C)   Resp: 18       Final diagnoses:  Pharyngitis       Mckynlie Vanderslice, PA-C 06/29/15 1133  Gwyneth Sprout, MD 06/29/15 1539

## 2015-06-29 NOTE — Discharge Instructions (Signed)
You were seen and evaluated for sore throat.  Rapid strep was negative.  You will be contacted if cultures are positive and at that time you will be prescribed antibiotics.  If the culture is negative you will not be contacted.  You may enroll or log into My Chart to see your results.  Take ibuprofen 600 - 800 mg by mouth every 6 hours as needed for pain.   You can also take 1000 mg tylenol every 6 hours as well, alternating medications every three hours. Do not exceed 4000 mg of tylenol in 24 hours.   Continue supportive treatment with clear fluids, lozenges or throat sprays as needed for discomfort.  Pharyngitis Pharyngitis is redness, pain, and swelling (inflammation) of your pharynx.  CAUSES  Pharyngitis is usually caused by infection. Most of the time, these infections are from viruses (viral) and are part of a cold. However, sometimes pharyngitis is caused by bacteria (bacterial). Pharyngitis can also be caused by allergies. Viral pharyngitis may be spread from person to person by coughing, sneezing, and personal items or utensils (cups, forks, spoons, toothbrushes). Bacterial pharyngitis may be spread from person to person by more intimate contact, such as kissing.  SIGNS AND SYMPTOMS  Symptoms of pharyngitis include:   Sore throat.   Tiredness (fatigue).   Low-grade fever.   Headache.  Joint pain and muscle aches.  Skin rashes.  Swollen lymph nodes.  Plaque-like film on throat or tonsils (often seen with bacterial pharyngitis). DIAGNOSIS  Your health care provider will ask you questions about your illness and your symptoms. Your medical history, along with a physical exam, is often all that is needed to diagnose pharyngitis. Sometimes, a rapid strep test is done. Other lab tests may also be done, depending on the suspected cause.  TREATMENT  Viral pharyngitis will usually get better in 3-4 days without the use of medicine. Bacterial pharyngitis is treated with medicines  that kill germs (antibiotics).  HOME CARE INSTRUCTIONS   Drink enough water and fluids to keep your urine clear or pale yellow.   Only take over-the-counter or prescription medicines as directed by your health care provider:   If you are prescribed antibiotics, make sure you finish them even if you start to feel better.   Do not take aspirin.   Get lots of rest.   Gargle with 8 oz of salt water ( tsp of salt per 1 qt of water) as often as every 1-2 hours to soothe your throat.   Throat lozenges (if you are not at risk for choking) or sprays may be used to soothe your throat. SEEK MEDICAL CARE IF:   You have large, tender lumps in your neck.  You have a rash.  You cough up green, yellow-brown, or bloody spit. SEEK IMMEDIATE MEDICAL CARE IF:   Your neck becomes stiff.  You drool or are unable to swallow liquids.  You vomit or are unable to keep medicines or liquids down.  You have severe pain that does not go away with the use of recommended medicines.  You have trouble breathing (not caused by a stuffy nose). MAKE SURE YOU:   Understand these instructions.  Will watch your condition.  Will get help right away if you are not doing well or get worse.   This information is not intended to replace advice given to you by your health care provider. Make sure you discuss any questions you have with your health care provider.   Document Released: 05/06/2005 Document  Revised: 02/24/2013 Document Reviewed: 01/11/2013 Elsevier Interactive Patient Education 2016 Else[employer] Inc.  Rapid Strep Test Strep throat is a bacterial infection caused by the bacteria Streptococcus pyogenes. A rapid strep test is the quickest way to check if these bacteria are causing your sore throat. The test can be done at your health care provider's office. Results are usually ready in 10-20 minutes. You may have this test if you have symptoms of strep throat. These include:   A red throat with  yellow or white spots.  Neck swelling and tenderness.  Fever.  Loss of appetite.  Trouble breathing or swallowing.  Rash.  Dehydration. This test requires a sample of fluid from the back of your throat and tonsils. Your health care provider may hold down your tongue with a tongue depressor and use a swab to collect the sample.  Your health care provider may collect a second sample at the same time. The second sample may be used for a throat culture. In a culture test, the sample is combined with a substance that encourages bacteria to grow. It takes longer to get the results of the throat culture test, but they are more accurate. They can confirm the results from a rapid strep test, or show that those results were wrong. RESULTS  It is your responsibility to obtain your test results. Ask the lab or department performing the test when and how you will get your results. Contact your health care provider to discuss any questions you have about your results.  The results of the rapid strep test will be negative or positive.  Meaning of Negative Test Results If the result of your rapid strep test is negative, then it means:   It is likely that you do not have strep throat.  A virus may be causing your sore throat. Your health care provider may do a throat culture to confirm the results of the rapid strep test. The throat culture can also identify the different strains of strep bacteria. Meaning of Positive Test Results If the result of your rapid strep test is positive, then it means:  It is likely that you do have strep throat.  You may have to take antibiotics. Your health care provider may do a throat culture to confirm the results of the rapid strep test. Strep throat usually requires a course of antibiotics.    This information is not intended to replace advice given to you by your health care provider. Make sure you discuss any questions you have with your health care provider.     Document Released: 06/13/2004 Document Revised: 05/27/2014 Document Reviewed: 08/12/2013 Elsevier Interactive Patient Education [employer]

## 2015-06-29 NOTE — ED Notes (Signed)
Pt reports 18 hour hx of sore throat, Denies fever. Treated with motrin x 2

## 2015-06-29 NOTE — Progress Notes (Signed)
CM spoke with pt who confirms uninsured [employer] resident with no pcp.  CM discussed and provided written information for uninsured accepting pcps, discussed the importance of pcp vs EDP services for f/u care, www.needymeds.org, www.goodrx.com, discounted pharmacies and other [employer] such as [facility] , [facility], affordable care act, financial assistance, uninsured dental services, [place] med assist, DSS and  health department  Reviewed resources for [employer] uninsured accepting pcps like [facility], family medicine at [facility], [facility], [facility], local urgent care centers, [facility], [facility] family practice, general medical clinics, family services of the [place], [facility] urgent care plus others, medication resources, [facility] out patient pharmacies and housing Pt voiced understanding and appreciation of resources provided   Provided [facility] contact information Pt seen by [employer] and provided resources and application  Cm encouraged use of goodrx for medication assistance

## 2015-07-01 LAB — CULTURE, GROUP A STREP (THRC)

## 2016-04-29 ENCOUNTER — Emergency Department (HOSPITAL_COMMUNITY)
Admission: EM | Admit: 2016-04-29 | Discharge: 2016-04-29 | Disposition: A | Discharge status: Home / Self Care | Payer: Self-pay | Attending: Emergency Medicine | Admitting: Emergency Medicine

## 2016-04-29 ENCOUNTER — Encounter (HOSPITAL_COMMUNITY): Payer: Self-pay | Admitting: Emergency Medicine

## 2016-04-29 DIAGNOSIS — K0889 Other specified disorders of teeth and supporting structures: Secondary | ICD-10-CM | POA: Insufficient documentation

## 2016-04-29 DIAGNOSIS — F17200 Nicotine dependence, unspecified, uncomplicated: Secondary | ICD-10-CM | POA: Insufficient documentation

## 2016-04-29 MED ORDER — NAPROXEN 500 MG PO TABS: 500.0000 mg | ORAL_TABLET | Freq: Two times a day (BID) | ORAL | 0 refills | Status: DC

## 2016-04-29 MED ORDER — LIDOCAINE VISCOUS 2 % MT SOLN: OROMUCOSAL | 0 refills | Status: DC

## 2016-04-29 MED ORDER — PENICILLIN V POTASSIUM 500 MG PO TABS: 500.0000 mg | ORAL_TABLET | Freq: Four times a day (QID) | ORAL | 0 refills | Status: DC

## 2016-04-29 NOTE — Discharge Instructions (Signed)
Take the prescribed medication as directed. Unfortunately, there is not a dentist on call today.  Please see attached list of locals clinics to find a dentist to follow-up with. Return to the ED for new or worsening symptoms.

## 2016-04-29 NOTE — ED Provider Notes (Signed)
[facility]-EMERGENCY DEPT Provider Note   CSN: [id number] Arrival date & time: 04/29/16  0759     History   Chief Complaint Chief Complaint  Patient presents with  . Dental Pain    HPI Julie Smith is a 40 y.o. female.  The history is provided by the patient and medical records.  Dental Pain      40 year old female here with right upper dental pain for the past 3 days. Reports pain is sharp, shooting to her right temple. Pain is worse with chewing on the affected side or if heart: Substances that her tooth. Does report some mild swelling of the gums. She has no fever or chills. No difficulty swallowing. No facial or neck swelling. Patient is not currently established with a dentist. She has been taking over-the-counter Motrin and applied Orajel without significant relief.  No past medical history on file.  Patient Active Problem List   Diagnosis Date Noted  . Near syncope 06/21/2013    Past Surgical History:  Procedure Laterality Date  . ECTOPIC PREGNANCY SURGERY    . SKIN BIOPSY      OB History    No data available       Home Medications    Prior to Admission medications   Medication Sig Start Date End Date Taking? Authorizing Provider  amoxicillin (AMOXIL) 500 MG capsule Take 1 capsule (500 mg total) by mouth 3 (three) times daily. 05/16/14   Cathren LaineKevin Steinl, MD  ciprofloxacin (CIPRO) 500 MG tablet Take 1 tablet (500 mg total) by mouth every 12 (twelve) hours. 09/15/14   Graylon GoodZachary H Baker, PA-C  clindamycin (CLEOCIN) 150 MG capsule Take 1 capsule (150 mg total) by mouth every 6 (six) hours. 07/22/14   Junius FinnerErin O'Malley, PA-C  ibuprofen (ADVIL,MOTRIN) 200 MG tablet Take 800 mg by mouth every 6 (six) hours as needed.    Historical Provider, MD  ibuprofen (ADVIL,MOTRIN) 600 MG tablet Take 1 tablet (600 mg total) by mouth every 6 (six) hours as needed. 07/22/14   Junius FinnerErin O'Malley, PA-C  naproxen sodium (ANAPROX) 220 MG tablet Take 660 mg by mouth 2 (two) times daily as needed (pain).     Historical Provider, MD    Family History No family history on file.  Social History Social History  Substance Use Topics  . Smoking status: Current Every Day Smoker  . Smokeless tobacco: Never Used  . Alcohol use Yes     Comment: occasional     Allergies   Codeine   Review of Systems Review of Systems  HENT: Positive for dental problem.   All other systems reviewed and are negative.    Physical Exam Updated Vital Signs BP (!) 148/101   Pulse 84   Temp 98.6 F (37 C) (Oral)   Resp 18   LMP 04/03/2016   SpO2 100%   Physical Exam  Constitutional: She is oriented to person, place, and time. She appears well-developed and well-nourished.  HENT:  Head: Normocephalic and atraumatic.  Mouth/Throat: Oropharynx is clear and moist.  Teeth largely in fair dentition, right upper molar broken with central decay noted, surrounding gingiva mildly swollen with some extension into the inner cheek, handling secretions appropriately, no trismus, no external facial or neck swelling, normal phonation without stridor  Eyes: Conjunctivae and EOM are normal. Pupils are equal, round, and reactive to light.  Neck: Normal range of motion.  Cardiovascular: Normal rate, regular rhythm and normal heart sounds.   Pulmonary/Chest: Effort normal and breath sounds normal. No respiratory  distress. She has no wheezes.  Abdominal: Soft. Bowel sounds are normal. There is no tenderness. There is no rebound.  Musculoskeletal: Normal range of motion.  Neurological: She is alert and oriented to person, place, and time.  Skin: Skin is warm and dry.  Psychiatric: She has a normal mood and affect.  Nursing note and vitals reviewed.    ED Treatments / Results  Labs (all labs ordered are listed, but only abnormal results are displayed) Labs Reviewed - No data to display  EKG  EKG Interpretation None       Radiology No results found.  Procedures Procedures (including critical care  time)  Medications Ordered in ED Medications - No data to display   Initial Impression / Assessment and Plan / ED Course  I have reviewed the triage vital signs and the nursing notes.  Pertinent labs & imaging results that were available during my care of the patient were reviewed by me and considered in my medical decision making (see chart for details).  Clinical Course    40 year old female here with right upper dental pain. She is afebrile and nontoxic. Exam is concerning for developing dental infection. She has no facial or neck swelling. Handling her secretions well, no stridor. Not clinically concerning for Ludwig's angina. Will start on antibiotics and have her follow-up with dentist.  She was given resource guide as there is no dentist on call today.  Discussed plan with patient, she acknowledged understanding and agreed with plan of care.  Return precautions given for new or worsening symptoms.  Final Clinical Impressions(s) / ED Diagnoses   Final diagnoses:  Pain, dental    New Prescriptions Discharge Medication List as of 04/29/2016  8:41 AM    START taking these medications   Details  lidocaine (XYLOCAINE) 2 % solution Apply topically as needed for dental pain., Print    naproxen (NAPROSYN) 500 MG tablet Take 1 tablet (500 mg total) by mouth 2 (two) times daily with a meal., Starting Mon 04/29/2016, Print    penicillin v potassium (VEETID) 500 MG tablet Take 1 tablet (500 mg total) by mouth 4 (four) times daily., Starting Mon 04/29/2016, Print         Garlon HatchetLisa M Alee Gressman, PA-C 04/29/16 0913    Mancel BaleElliott Wentz, MD 05/01/16 2042

## 2016-04-29 NOTE — ED Triage Notes (Signed)
Pt reports upper right toothache x 2 days, also reports right facial edema . Possible tooth abscess.

## 2016-04-29 NOTE — ED Notes (Signed)
Patient is A & O x4.  She understood discharge instructions. 

## 2019-05-31 ENCOUNTER — Encounter: Payer: Self-pay | Admitting: Family Medicine

## 2019-05-31 ENCOUNTER — Ambulatory Visit: Payer: Self-pay | Attending: Family Medicine | Admitting: Family Medicine

## 2019-05-31 VITALS — BP 129/80 | HR 86 | Temp 98.2°F | Ht 71.0 in | Wt 206.0 lb

## 2019-05-31 DIAGNOSIS — F329 Major depressive disorder, single episode, unspecified: Secondary | ICD-10-CM

## 2019-05-31 DIAGNOSIS — I10 Essential (primary) hypertension: Secondary | ICD-10-CM | POA: Insufficient documentation

## 2019-05-31 DIAGNOSIS — F419 Anxiety disorder, unspecified: Secondary | ICD-10-CM

## 2019-05-31 MED ORDER — HYDROXYZINE HCL 25 MG PO TABS: 25.0000 mg | ORAL_TABLET | Freq: Three times a day (TID) | ORAL | 1 refills | Status: DC | PRN

## 2019-05-31 MED ORDER — AMLODIPINE BESYLATE 5 MG PO TABS: 5.0000 mg | ORAL_TABLET | Freq: Every day | ORAL | 6 refills | Status: DC

## 2019-05-31 NOTE — Progress Notes (Signed)
Subjective:  Patient ID: Julie Smith, female    DOB: 12/26/1975  Age: 44 y.o. MRN: [id number]  CC: New Patient (Initial Visit)   HPI Julie Smith is a 44 year old female with a history of hypertension who presents today to establish care.  Stopped working not long after COVID -19 pandemic commenced and noticed she is now depressed. 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 Her heart races and she has anxiety not precepitiated by any events. Uses [employer] for sleep Won't get out the bed, cries a lot but denies suicidal ideation or intent. She does have family and friends but a couple of family members have underlying chronic medical conditions and she would not like to put them at risk of getting sick.  Past Medical History:  Diagnosis Date  . Hypertension     Past Surgical History:  Procedure Laterality Date  . ECTOPIC PREGNANCY SURGERY    . SKIN BIOPSY      No family history on file.  Allergies  Allergen Reactions  . Codeine Other (See Comments)    Hallucinations     Outpatient Medications Prior to Visit  Medication Sig Dispense Refill  . amLODipine (NORVASC) 5 MG tablet Take 5 mg by mouth daily.    Marland Kitchen amoxicillin (AMOXIL) 500 MG capsule Take 1 capsule (500 mg total) by mouth 3 (three) times daily. (Patient not taking: Reported on 05/31/2019) 21 capsule 0  . ciprofloxacin (CIPRO) 500 MG tablet Take 1 tablet (500 mg total) by mouth every 12 (twelve) hours. (Patient not taking: Reported on 05/31/2019) 20 tablet 0  . clindamycin (CLEOCIN) 150 MG capsule Take 1 capsule (150 mg total) by mouth every 6 (six) hours. (Patient not taking: Reported on 05/31/2019) 28 capsule 0  . ibuprofen (ADVIL,MOTRIN) 200 MG tablet Take 800 mg by mouth every 6 (six) hours as needed.    Marland Kitchen ibuprofen (ADVIL,MOTRIN) 600 MG tablet Take 1 tablet (600 mg total) by mouth every 6 (six) hours as needed. (Patient not taking: Reported on 05/31/2019) 30 tablet 0  . lidocaine  (XYLOCAINE) 2 % solution Apply topically as needed for dental pain. (Patient not taking: Reported on 05/31/2019) 150 mL 0  . naproxen (NAPROSYN) 500 MG tablet Take 1 tablet (500 mg total) by mouth 2 (two) times daily with a meal. (Patient not taking: Reported on 05/31/2019) 30 tablet 0  . naproxen sodium (ANAPROX) 220 MG tablet Take 660 mg by mouth 2 (two) times daily as needed (pain).    Marland Kitchen penicillin v potassium (VEETID) 500 MG tablet Take 1 tablet (500 mg total) by mouth 4 (four) times daily. (Patient not taking: Reported on 05/31/2019) 40 tablet 0   No facility-administered medications prior to visit.     ROS Review of Systems  Constitutional: Negative for activity change, appetite change and fatigue.  HENT: Negative for congestion, sinus pressure and sore throat.   Eyes: Negative for visual disturbance.  Respiratory: Negative for cough, chest tightness, shortness of breath and wheezing.   Cardiovascular: Negative for chest pain and palpitations.  Gastrointestinal: Negative for abdominal distention, abdominal pain and constipation.  Endocrine: Negative for polydipsia.  Genitourinary: Negative for dysuria and frequency.  Musculoskeletal: Negative for arthralgias and back pain.  Skin: Negative for rash.  Neurological: Negative for tremors, light-headedness and numbness.  Hematological: Does not bruise/bleed easily.  Psychiatric/Behavioral: Positive for dysphoric mood. Negative for agitation and behavioral problems.    Objective:  BP 129/80   Pulse 86  Temp 98.2 F (36.8 C) (Oral)   Ht 5\' 11"  (1.803 m)   Wt 206 lb (93.4 kg)   SpO2 100%   BMI 28.73 kg/m   BP/Weight 05/31/2019 10/93/2355 11/19/2200  Systolic BP 542 706 237  Diastolic BP 80 628 84  Wt. (Lbs) 206 - -  BMI 28.73 - -      Physical Exam Constitutional:      Appearance: She is well-developed.  Neck:     Vascular: No JVD.  Cardiovascular:     Rate and Rhythm: Normal rate.     Heart sounds: Normal heart sounds.  No murmur.  Pulmonary:     Effort: Pulmonary effort is normal.     Breath sounds: Normal breath sounds. No wheezing or rales.  Chest:     Chest wall: No tenderness.  Abdominal:     General: Bowel sounds are normal. There is no distension.     Palpations: Abdomen is soft. There is no mass.     Tenderness: There is no abdominal tenderness.  Musculoskeletal:        General: Normal range of motion.     Right lower leg: No edema.     Left lower leg: No edema.  Neurological:     Mental Status: She is alert and oriented to person, place, and time.  Psychiatric:     Comments: Dysphoric mood     CMP Latest Ref Rng & Units 09/15/2014 06/21/2013  Glucose 70 - 99 mg/dL 89 94  BUN 6 - 23 mg/dL 11 16  Creatinine 0.50 - 1.10 mg/dL 0.80 0.82  Sodium 135 - 145 mmol/L 140 138  Potassium 3.5 - 5.1 mmol/L 3.5 4.2  Chloride 96 - 112 mmol/L 106 104  CO2 19 - 32 mEq/L - 21  Calcium 8.4 - 10.5 mg/dL - 9.2    Lipid Panel  No results found for: CHOL, TRIG, HDL, CHOLHDL, VLDL, LDLCALC, LDLDIRECT  CBC    Component Value Date/Time   WBC 11.3 (H) 06/21/2013 0800   RBC 4.42 06/21/2013 0800   HGB 14.3 09/15/2014 2009   HCT 42.0 09/15/2014 2009   PLT 228 06/21/2013 0800   MCV 92.1 06/21/2013 0800   MCH 31.9 06/21/2013 0800   MCHC 34.6 06/21/2013 0800   RDW 12.0 06/21/2013 0800    No results found for: HGBA1C  Assessment & Plan:  1. Essential hypertension Controlled Counseled on blood pressure goal of less than 130/80, low-sodium, DASH diet, medication compliance, 150 minutes of moderate intensity exercise per week. Discussed medication compliance, adverse effects. - amLODipine (NORVASC) 5 MG tablet; Take 1 tablet (5 mg total) by mouth daily.  Dispense: 30 tablet; Refill: 6  2. Anxiety and depression Uncontrolled Secondary to ongoing pandemic She is not open to commencing a chronic medication hence I will hold off on an SSRI She will benefit from psychotherapy and cognitive behavioral  therapy and has been referred to the LCSW Counseled on self motivation, exercise and other activities to keep her engaged. - hydrOXYzine (ATARAX/VISTARIL) 25 MG tablet; Take 1 tablet (25 mg total) by mouth 3 (three) times daily as needed.  Dispense: 60 tablet; Refill: 1   Health Care Maintenance: At next visit No orders of the defined types were placed in this encounter.   Follow-up:   Return for Anxiety and depression with Jasmine; complete physical exam with PCP in 6 weeks.      Charlott Rakes, MD, FAAFP. [facility] and [facility] [place], [place]  05/31/2019, 2:12 PM

## 2019-05-31 NOTE — Patient Instructions (Signed)

## 2019-06-07 ENCOUNTER — Other Ambulatory Visit: Payer: Self-pay

## 2019-06-07 ENCOUNTER — Ambulatory Visit: Payer: Self-pay | Attending: Family Medicine | Admitting: Licensed Clinical Social Worker

## 2019-06-07 DIAGNOSIS — F4323 Adjustment disorder with mixed anxiety and depressed mood: Secondary | ICD-10-CM

## 2019-06-07 NOTE — Progress Notes (Signed)
Integrated Behavioral Health Visit via Telemedicine (Telephone)  06/07/2019 CACHET MCCUTCHEN [id number]   Session Start time: 4:00 PM  Session End time: 4:30 PM Total time: 30  Referring Provider: Dr. Alvis Lemmings Type of Visit: Telephonic Patient location: Home [facility] Provider location: Office All persons participating in visit: LCSW and Patient  Confirmed patient's address: Yes  Confirmed patient's phone number: Yes  Any changes to demographics: No   Confirmed patient's insurance: Yes  Any changes to patient's insurance: No   Discussed confidentiality: Yes    The following statements were read to the patient and/or legal guardian that are established with the [facility] Provider.  "The purpose of this phone visit is to provide behavioral health care while limiting exposure to the coronavirus (COVID19).  There is a possibility of technology failure and discussed alternative modes of communication if that failure occurs."  "By engaging in this telephone visit, you consent to the provision of healthcare.  Additionally, you authorize for your insurance to be billed for the services provided during this telephone visit."   Patient and/or legal guardian consented to telephone visit: Yes   PRESENTING CONCERNS: Patient and/or family reports the following symptoms/concerns: Pt reports difficulty managing anxiety and depression symptoms triggered by stress from the pandemic, in addition, to financial strain. Symptoms include panic attacks, low energy/motivation, crying episodes for the last three months, sleeping difficulty (3-5 hours daily), and a decreased appetite  Duration of problem: 3 months; Severity of problem: moderate  STRENGTHS (Protective Factors/Coping Skills): Pt has good insight Pt has a strong support system  GOALS ADDRESSED: Patient will: 1.  Reduce symptoms of: anxiety and depression  2.  Increase knowledge and/or ability of: coping skills  3.  Demonstrate ability to:  Increase healthy adjustment to current life circumstances  INTERVENTIONS: Interventions utilized:  Supportive Counseling, Sleep Hygiene and Psychoeducation and/or Health Education Standardized Assessments completed: Not Needed  ASSESSMENT: Patient currently experiencing anxiety and depression symptoms triggered by psychosocial stressors. Pt has not worked since June and would like to reduce risk of exposure to COVID19 noting that family who reside with patient have underlying medical conditions.   Patient may benefit from brief therapy. Pt is unsure about medication management due to previous hx of opioid dependency. LCSW discussed strategies to assist in decreasing and/or management of symptoms, including sleep hygiene. Pt was successful in identifying healthy coping skills.   PLAN: 1. Follow up with behavioral health clinician on : Schedule follow up with LCSW 2. Behavioral recommendations: Utilize strategies discussed in session and continue utilizing hydroxyzine as directed 3. Referral(s): [facility] [employer] (In Clinic)  Bridgett Larsson, [place] 06/18/19 9:35 AM

## 2019-07-12 ENCOUNTER — Encounter: Payer: Self-pay | Admitting: Family Medicine

## 2019-07-12 ENCOUNTER — Ambulatory Visit: Payer: Self-pay | Attending: Family Medicine | Admitting: Family Medicine

## 2019-07-12 ENCOUNTER — Other Ambulatory Visit: Payer: Self-pay

## 2019-07-12 VITALS — BP 127/88 | HR 97 | Temp 97.0°F | Resp 16 | Ht 71.0 in | Wt 201.0 lb

## 2019-07-12 DIAGNOSIS — Z124 Encounter for screening for malignant neoplasm of cervix: Secondary | ICD-10-CM

## 2019-07-12 DIAGNOSIS — R600 Localized edema: Secondary | ICD-10-CM

## 2019-07-12 DIAGNOSIS — Z0000 Encounter for general adult medical examination without abnormal findings: Secondary | ICD-10-CM

## 2019-07-12 DIAGNOSIS — Z1231 Encounter for screening mammogram for malignant neoplasm of breast: Secondary | ICD-10-CM

## 2019-07-12 DIAGNOSIS — Z720 Tobacco use: Secondary | ICD-10-CM

## 2019-07-12 MED ORDER — HYDROCHLOROTHIAZIDE 25 MG PO TABS: 25.0000 mg | ORAL_TABLET | Freq: Every day | ORAL | 1 refills | Status: DC

## 2019-07-12 MED ORDER — BUPROPION HCL ER (SR) 150 MG PO TB12: 150.0000 mg | ORAL_TABLET | Freq: Two times a day (BID) | ORAL | 3 refills | Status: DC

## 2019-07-12 MED ORDER — POTASSIUM CHLORIDE ER 10 MEQ PO TBCR: 10.0000 meq | EXTENDED_RELEASE_TABLET | Freq: Every day | ORAL | 6 refills | Status: DC

## 2019-07-12 MED FILL — POTASSIUM CHLORIDE ER 10 ME: 10 | 30 days supply | Qty: 30 | Fill #0

## 2019-07-12 MED FILL — BUPROPION SR 150 MG TABLET: 150 | 30 days supply | Qty: 60 | Fill #0

## 2019-07-12 MED FILL — HYDROCHLOROTHIAZIDE 25 MG T: 25 | 30 days supply | Qty: 30 | Fill #0

## 2019-07-12 NOTE — Patient Instructions (Signed)
Once I review your test results, I will be in touch with you via 'my chart' or a phone call from my office (if you are not signed up for my chart).    Health Maintenance, Female Adopting a healthy lifestyle and getting preventive care are important in promoting health and wellness. Ask your health care provider about:  The right schedule for you to have regular tests and exams.  Things you can do on your own to prevent diseases and keep yourself healthy. What should I know about diet, weight, and exercise? Eat a healthy diet   Eat a diet that includes plenty of vegetables, fruits, low-fat dairy products, and lean protein.  Do not eat a lot of foods that are high in solid fats, added sugars, or sodium. Maintain a healthy weight Body mass index (BMI) is used to identify weight problems. It estimates body fat based on height and weight. Your health care provider can help determine your BMI and help you achieve or maintain a healthy weight. Get regular exercise Get regular exercise. This is one of the most important things you can do for your health. Most adults should:  Exercise for at least 150 minutes each week. The exercise should increase your heart rate and make you sweat (moderate-intensity exercise).  Do strengthening exercises at least twice a week. This is in addition to the moderate-intensity exercise.  Spend less time sitting. Even light physical activity can be beneficial. Watch cholesterol and blood lipids Have your blood tested for lipids and cholesterol at 43 years of age, then have this test every 5 years. Have your cholesterol levels checked more often if:  Your lipid or cholesterol levels are high.  You are older than 43 years of age.  You are at high risk for heart disease. What should I know about cancer screening? Depending on your health history and family history, you may need to have cancer screening at various ages. This may include screening for:  Breast  cancer.  Cervical cancer.  Colorectal cancer.  Skin cancer.  Lung cancer. What should I know about heart disease, diabetes, and high blood pressure? Blood pressure and heart disease  High blood pressure causes heart disease and increases the risk of stroke. This is more likely to develop in people who have high blood pressure readings, are of African descent, or are overweight.  Have your blood pressure checked: ? Every 3-5 years if you are 19-62 years of age. ? Every year if you are 71 years old or older. Diabetes Have regular diabetes screenings. This checks your fasting blood sugar level. Have the screening done:  Once every three years after age 69 if you are at a normal weight and have a low risk for diabetes.  More often and at a younger age if you are overweight or have a high risk for diabetes. What should I know about preventing infection? Hepatitis B If you have a higher risk for hepatitis B, you should be screened for this virus. Talk with your health care provider to find out if you are at risk for hepatitis B infection. Hepatitis C Testing is recommended for:  Everyone born from [birth year] through 1964.  Anyone with known risk factors for hepatitis C. Sexually transmitted infections (STIs)  Get screened for STIs, including gonorrhea and chlamydia, if: ? You are sexually active and are younger than 43 years of age. ? You are older than 43 years of age and your health care provider tells you that you  are at risk for this type of infection. ? Your sexual activity has changed since you were last screened, and you are at increased risk for chlamydia or gonorrhea. Ask your health care provider if you are at risk.  Ask your health care provider about whether you are at high risk for HIV. Your health care provider may recommend a prescription medicine to help prevent HIV infection. If you choose to take medicine to prevent HIV, you should first get tested for HIV. You should  then be tested every 3 months for as long as you are taking the medicine. Pregnancy  If you are about to stop having your period (premenopausal) and you may become pregnant, seek counseling before you get pregnant.  Take 400 to 800 micrograms (mcg) of folic acid every day if you become pregnant.  Ask for birth control (contraception) if you want to prevent pregnancy. Osteoporosis and menopause Osteoporosis is a disease in which the bones lose minerals and strength with aging. This can result in bone fractures. If you are 68 years old or older, or if you are at risk for osteoporosis and fractures, ask your health care provider if you should:  Be screened for bone loss.  Take a calcium or vitamin D supplement to lower your risk of fractures.  Be given hormone replacement therapy (HRT) to treat symptoms of menopause. Follow these instructions at home: Lifestyle  Do not use any products that contain nicotine or tobacco, such as cigarettes, e-cigarettes, and chewing tobacco. If you need help quitting, ask your health care provider.  Do not use street drugs.  Do not share needles.  Ask your health care provider for help if you need support or information about quitting drugs. Alcohol use  Do not drink alcohol if: ? Your health care provider tells you not to drink. ? You are pregnant, may be pregnant, or are planning to become pregnant.  If you drink alcohol: ? Limit how much you use to 0-1 drink a day. ? Limit intake if you are breastfeeding.  Be aware of how much alcohol is in your drink. In the [place] one drink equals one 12 oz bottle of beer (355 mL), one 5 oz glass of wine (148 mL), or one 1 oz glass of hard liquor (44 mL). General instructions  Schedule regular health, dental, and eye exams.  Stay current with your vaccines.  Tell your health care provider if: ? You often feel depressed. ? You have ever been abused or do not feel safe at home. Summary  Adopting a  healthy lifestyle and getting preventive care are important in promoting health and wellness.  Follow your health care provider's instructions about healthy diet, exercising, and getting tested or screened for diseases.  Follow your health care provider's instructions on monitoring your cholesterol and blood pressure. This information is not intended to replace advice given to you by your health care provider. Make sure you discuss any questions you have with your health care provider. Document Revised: 04/29/2018 Document Reviewed: 04/29/2018 Elsevier Patient Education  2020 [employer].

## 2019-07-12 NOTE — Progress Notes (Signed)
Subjective:  Patient ID: Julie Smith, female    DOB: July 22, 1975  Age: 44 y.o. MRN: [id number]  CC: Annual Exam   HPI Julie Smith presents for a complete physical exam. 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 She also has depression and anxiety and was commenced on hydroxyzine at her last office visit.  Also had a visit with the LCSW.  Past Medical History:  Diagnosis Date  . Hypertension     Past Surgical History:  Procedure Laterality Date  . ECTOPIC PREGNANCY SURGERY    . SKIN BIOPSY      No family history on file.  Allergies  Allergen Reactions  . Codeine Other (See Comments)    Hallucinations     Outpatient Medications Prior to Visit  Medication Sig Dispense Refill  . hydrOXYzine (ATARAX/VISTARIL) 25 MG tablet Take 1 tablet (25 mg total) by mouth 3 (three) times daily as needed. 60 tablet 1  . ibuprofen (ADVIL,MOTRIN) 200 MG tablet Take 800 mg by mouth every 6 (six) hours as needed.    Marland Kitchen amLODipine (NORVASC) 5 MG tablet Take 1 tablet (5 mg total) by mouth daily. 30 tablet 6  . amoxicillin (AMOXIL) 500 MG capsule Take 1 capsule (500 mg total) by mouth 3 (three) times daily. (Patient not taking: Reported on 05/31/2019) 21 capsule 0  . ciprofloxacin (CIPRO) 500 MG tablet Take 1 tablet (500 mg total) by mouth every 12 (twelve) hours. (Patient not taking: Reported on 05/31/2019) 20 tablet 0  . clindamycin (CLEOCIN) 150 MG capsule Take 1 capsule (150 mg total) by mouth every 6 (six) hours. (Patient not taking: Reported on 05/31/2019) 28 capsule 0  . ibuprofen (ADVIL,MOTRIN) 600 MG tablet Take 1 tablet (600 mg total) by mouth every 6 (six) hours as needed. (Patient not taking: Reported on 05/31/2019) 30 tablet 0  . lidocaine (XYLOCAINE) 2 % solution Apply topically as needed for dental pain. (Patient not taking: Reported on 05/31/2019) 150 mL 0  . naproxen (NAPROSYN) 500 MG tablet Take 1 tablet (500 mg total) by mouth 2 (two) times daily  with a meal. (Patient not taking: Reported on 05/31/2019) 30 tablet 0  . naproxen sodium (ANAPROX) 220 MG tablet Take 660 mg by mouth 2 (two) times daily as needed (pain).    Marland Kitchen penicillin v potassium (VEETID) 500 MG tablet Take 1 tablet (500 mg total) by mouth 4 (four) times daily. (Patient not taking: Reported on 05/31/2019) 40 tablet 0   No facility-administered medications prior to visit.     ROS Review of Systems  Constitutional: Negative for activity change, appetite change and fatigue.  HENT: Negative for congestion, sinus pressure and sore throat.   Eyes: Negative for visual disturbance.  Respiratory: Negative for cough, chest tightness, shortness of breath and wheezing.   Cardiovascular: Positive for leg swelling. Negative for chest pain and palpitations.  Gastrointestinal: Negative for abdominal distention, abdominal pain and constipation.  Endocrine: Negative for polydipsia.  Genitourinary: Negative for dysuria and frequency.  Musculoskeletal: Negative for arthralgias and back pain.  Skin: Negative for rash.  Neurological: Negative for tremors, light-headedness and numbness.  Hematological: Does not bruise/bleed easily.  Psychiatric/Behavioral: Negative for agitation and behavioral problems.    Objective:  BP 127/88   Pulse 97   Temp (!) 97 F (36.1 C)   Resp 16   Ht '5\' 11"'$  (1.803 m)   Wt 201 lb (91.2 kg)   LMP 06/04/2019 (Approximate)   SpO2  99%   BMI 28.03 kg/m   BP/Weight 07/12/2019 05/31/2019 09/98/3382  Systolic BP 505 397 673  Diastolic BP 88 80 419  Wt. (Lbs) 201 206 -  BMI 28.03 28.73 -      Physical Exam Constitutional:      General: She is not in acute distress.    Appearance: She is well-developed. She is not diaphoretic.  HENT:     Head: Normocephalic.     Right Ear: External ear normal.     Left Ear: External ear normal.     Nose: Nose normal.  Eyes:     Conjunctiva/sclera: Conjunctivae normal.     Pupils: Pupils are equal, round, and  reactive to light.  Neck:     Vascular: No JVD.  Cardiovascular:     Rate and Rhythm: Normal rate and regular rhythm.     Heart sounds: Normal heart sounds. No murmur. No gallop.   Pulmonary:     Effort: Pulmonary effort is normal. No respiratory distress.     Breath sounds: Normal breath sounds. No wheezing or rales.  Chest:     Chest wall: No tenderness.     Breasts:        Right: No mass or tenderness.        Left: No mass or tenderness.  Abdominal:     General: Bowel sounds are normal. There is no distension.     Palpations: Abdomen is soft. There is no mass.     Tenderness: There is no abdominal tenderness.  Genitourinary:    Comments: External genitalia, vagina, cervix, adnexa-normal No cervical motion tenderness Musculoskeletal:        General: No tenderness. Normal range of motion.     Cervical back: Normal range of motion.  Skin:    General: Skin is warm and dry.  Neurological:     Mental Status: She is alert and oriented to person, place, and time.     Deep Tendon Reflexes: Reflexes are normal and symmetric.     CMP Latest Ref Rng & Units 09/15/2014 06/21/2013  Glucose 70 - 99 mg/dL 89 94  BUN 6 - 23 mg/dL 11 16  Creatinine 0.50 - 1.10 mg/dL 0.80 0.82  Sodium 135 - 145 mmol/L 140 138  Potassium 3.5 - 5.1 mmol/L 3.5 4.2  Chloride 96 - 112 mmol/L 106 104  CO2 19 - 32 mEq/L - 21  Calcium 8.4 - 10.5 mg/dL - 9.2    Lipid Panel  No results found for: CHOL, TRIG, HDL, CHOLHDL, VLDL, LDLCALC, LDLDIRECT  CBC    Component Value Date/Time   WBC 11.3 (H) 06/21/2013 0800   RBC 4.42 06/21/2013 0800   HGB 14.3 09/15/2014 2009   HCT 42.0 09/15/2014 2009   PLT 228 06/21/2013 0800   MCV 92.1 06/21/2013 0800   MCH 31.9 06/21/2013 0800   MCHC 34.6 06/21/2013 0800   RDW 12.0 06/21/2013 0800    No results found for: HGBA1C  Assessment & Plan:  1. Annual physical exam Counseled on 150 minutes of exercise per week, healthy eating (including decreased daily intake of  saturated fats, cholesterol, added sugars, sodium),  - HIV Antibody (routine testing w rflx) - CMP14+EGFR  2. Encounter for screening mammogram for malignant neoplasm of breast - MM 3D SCREEN BREAST BILATERAL; Future  3. Screening for cervical cancer - Cytology - PAP([place])  4. Tobacco abuse Spent 3 minutes counseling on smoking cessation and she is willing work on quitting - buPROPion (WELLBUTRIN SR) 150  MG 12 hr tablet; Take 1 tablet (150 mg total) by mouth 2 (two) times daily. For Smoking cessation  Dispense: 60 tablet; Refill: 3  5. Localized edema We will discontinue amlodipine and commence HCTZ Also commenced on potassium given last potassium level from lower border of normal Elevate feet, Use compression stocking, low-sodium diet - CMP14+EGFR - hydrochlorothiazide (HYDRODIURIL) 25 MG tablet; Take 1 tablet (25 mg total) by mouth daily.  Dispense: 90 tablet; Refill: 1   We will have LCSW see for psychotherapy due to complaints of depression; she had declined SSRIs at last visit.  Meds ordered this encounter  Medications  . buPROPion (WELLBUTRIN SR) 150 MG 12 hr tablet    Sig: Take 1 tablet (150 mg total) by mouth 2 (two) times daily. For Smoking cessation    Dispense:  60 tablet    Refill:  3  . hydrochlorothiazide (HYDRODIURIL) 25 MG tablet    Sig: Take 1 tablet (25 mg total) by mouth daily.    Dispense:  90 tablet    Refill:  1    Discontinue amlodipine  . potassium chloride (KLOR-CON 10) 10 MEQ tablet    Sig: Take 1 tablet (10 mEq total) by mouth daily.    Dispense:  30 tablet    Refill:  6    Follow-up: Return in about 6 months (around 01/09/2020) for Chronic medical conditions.       Charlott Rakes, MD, FAAFP. [facility] [place], [place]   07/12/2019, 10:36 AM

## 2019-07-12 NOTE — Progress Notes (Signed)
Concerns with depression Hand and feet swelling

## 2019-07-13 ENCOUNTER — Telehealth: Payer: Self-pay | Admitting: Family Medicine

## 2019-07-13 LAB — CMP14+EGFR
ALT: 14 IU/L (ref 0–32)
AST: 18 IU/L (ref 0–40)
Albumin/Globulin Ratio: 1.9 (ref 1.2–2.2)
Albumin: 4.8 g/dL (ref 3.8–4.8)
Alkaline Phosphatase: 90 IU/L (ref 39–117)
BUN/Creatinine Ratio: 24 — ABNORMAL HIGH (ref 9–23)
BUN: 19 mg/dL (ref 6–24)
Bilirubin Total: 0.3 mg/dL (ref 0.0–1.2)
CO2: 20 mmol/L (ref 20–29)
Calcium: 9.4 mg/dL (ref 8.7–10.2)
Chloride: 107 mmol/L — ABNORMAL HIGH (ref 96–106)
Creatinine, Ser: 0.8 mg/dL (ref 0.57–1.00)
GFR calc Af Amer: 105 mL/min/{1.73_m2} (ref >59)
GFR calc non Af Amer: 91 mL/min/{1.73_m2} (ref >59)
Globulin, Total: 2.5 g/dL (ref 1.5–4.5)
Glucose: 80 mg/dL (ref 65–99)
Potassium: 4.2 mmol/L (ref 3.5–5.2)
Sodium: 141 mmol/L (ref 134–144)
Total Protein: 7.3 g/dL (ref 6.0–8.5)

## 2019-07-13 LAB — HIV ANTIBODY (ROUTINE TESTING W REFLEX): HIV Screen 4th Generation wRfx: NONREACTIVE

## 2019-07-13 NOTE — Telephone Encounter (Signed)
Patient was called and informed to discontinue the Amlodipine and take only the HCTZ.

## 2019-07-13 NOTE — Telephone Encounter (Signed)
Patient came in saying she was prescribed new medication and would like to know if she still needs to take the amlodipine. Please f/u

## 2019-07-15 ENCOUNTER — Telehealth: Payer: Self-pay

## 2019-07-15 LAB — CYTOLOGY - PAP
Comment: NEGATIVE
Diagnosis: NEGATIVE
High risk HPV: NEGATIVE

## 2019-07-15 NOTE — Telephone Encounter (Signed)
Patient name and DOB has been verified Patient was informed of lab results. Patient had no questions.  

## 2019-07-15 NOTE — Telephone Encounter (Signed)
-----   Message from Enobong Newlin, MD sent at 07/13/2019  8:22 AM EST ----- Please inform the patient that labs are normal. Thank you. 

## 2019-08-09 MED FILL — POTASSIUM CHLORIDE ER 10 ME: 10 | 30 days supply | Qty: 30 | Fill #1

## 2019-08-09 MED FILL — BUPROPION SR 150 MG TABLET: 150 | 30 days supply | Qty: 60 | Fill #1

## 2019-08-09 MED FILL — HYDROCHLOROTHIAZIDE 25 MG T: 25 | 30 days supply | Qty: 30 | Fill #1

## 2019-08-23 ENCOUNTER — Ambulatory Visit: Payer: Self-pay

## 2019-09-10 MED FILL — HYDROCHLOROTHIAZIDE 25 MG T: 25 | 30 days supply | Qty: 30 | Fill #2

## 2019-09-10 MED FILL — POTASSIUM CHLORIDE ER 10 ME: 10 | 30 days supply | Qty: 30 | Fill #2

## 2022-04-16 ENCOUNTER — Ambulatory Visit
Admission: EM | Admit: 2022-04-16 | Discharge: 2022-04-16 | Disposition: A | Discharge status: Home / Self Care | Payer: Self-pay | Attending: Internal Medicine | Admitting: Internal Medicine

## 2022-04-16 DIAGNOSIS — L089 Local infection of the skin and subcutaneous tissue, unspecified: Secondary | ICD-10-CM

## 2022-04-16 MED ORDER — DOXYCYCLINE HYCLATE 100 MG PO CAPS: 100.0000 mg | ORAL_CAPSULE | Freq: Two times a day (BID) | ORAL | 0 refills | Status: DC

## 2022-04-16 NOTE — ED Triage Notes (Signed)
Pt presents to uc with L pinky tow pain and swelling for one week. Pt reports bacitracin on the area as needed.

## 2022-04-16 NOTE — ED Provider Notes (Signed)
[facility]    CSN: [id number] Arrival date & time: 04/16/22  1416      History   Chief Complaint Chief Complaint  Patient presents with   Toe Pain    HPI Julie Smith is a 46 y.o. female.   Patient presents with left pinky toe swelling and pain that started about 1 week ago.  Patient denies any obvious injury to the area.  Denies numbness or tingling.  Patient can bear weight.  Patient has applied topical antibiotic ointment with minimal improvement.  Denies history of the same.  Patient denies history of diabetes.  She also has elevated blood pressure but denies history of hypertension.  Denies chest pain, shortness of breath, headache, dizziness, blurred vision, nausea, vomiting.   Toe Pain    Past Medical History:  Diagnosis Date   Hypertension     Patient Active Problem List   Diagnosis Date Noted   Hypertension    Near syncope 06/21/2013    Past Surgical History:  Procedure Laterality Date   ECTOPIC PREGNANCY SURGERY     SKIN BIOPSY      OB History   No obstetric history on file.      Home Medications    Prior to Admission medications   Medication Sig Start Date End Date Taking? Authorizing Provider  doxycycline (VIBRAMYCIN) 100 MG capsule Take 1 capsule (100 mg total) by mouth 2 (two) times daily. 04/16/22  Yes Maleigha Colvard, Rolly Salter E, FNP  buPROPion (WELLBUTRIN SR) 150 MG 12 hr tablet Take 1 tablet (150 mg total) by mouth 2 (two) times daily. For Smoking cessation Patient not taking: Reported on 04/16/2022 07/12/19   Hoy Register, MD  hydrochlorothiazide (HYDRODIURIL) 25 MG tablet Take 1 tablet (25 mg total) by mouth daily. Patient not taking: Reported on 04/16/2022 07/12/19   Hoy Register, MD  hydrOXYzine (ATARAX/VISTARIL) 25 MG tablet Take 1 tablet (25 mg total) by mouth 3 (three) times daily as needed. Patient not taking: Reported on 04/16/2022 05/31/19   Hoy Register, MD  ibuprofen (ADVIL,MOTRIN) 200 MG tablet Take 800 mg by mouth  every 6 (six) hours as needed.    [provider]  potassium chloride (KLOR-CON 10) 10 MEQ tablet Take 1 tablet (10 mEq total) by mouth daily. Patient not taking: Reported on 04/16/2022 07/12/19   Hoy Register, MD    Family History History reviewed. No pertinent family history.  Social History Social History   Tobacco Use   Smoking status: Every Day    Packs/day: 0.50    Years: 20.00    Total pack years: 10.00    Types: Cigarettes   Smokeless tobacco: Never  Substance Use Topics   Alcohol use: Yes    Comment: occasional   Drug use: No     Allergies   Codeine   Review of Systems Review of Systems Per HPI  Physical Exam Triage Vital Signs ED Triage Vitals  Enc Vitals Group     BP 04/16/22 1821 (!) 165/90     Pulse Rate 04/16/22 1821 60     Resp 04/16/22 1821 20     Temp 04/16/22 1821 98 F (36.7 C)     Temp Source 04/16/22 1821 Oral     SpO2 04/16/22 1821 98 %     Weight --      Height --      Head Circumference --      Peak Flow --      Pain Score 04/16/22 1829 5  Pain Loc --      Pain Edu? --      Excl. in GC? --    No data found.  Updated Vital Signs BP (!) 165/90 (BP Location: Left Arm)   Pulse 60   Temp 98 F (36.7 C) (Oral)   Resp 20   LMP 04/14/2022   SpO2 98%   Visual Acuity Right Eye Distance:   Left Eye Distance:   Bilateral Distance:    Right Eye Near:   Left Eye Near:    Bilateral Near:     Physical Exam Constitutional:      General: She is not in acute distress.    Appearance: Normal appearance. She is not toxic-appearing or diaphoretic.  HENT:     Head: Normocephalic and atraumatic.  Eyes:     Extraocular Movements: Extraocular movements intact.     Conjunctiva/sclera: Conjunctivae normal.  Cardiovascular:     Rate and Rhythm: Normal rate and regular rhythm.     Pulses: Normal pulses.     Heart sounds: Normal heart sounds.  Pulmonary:     Effort: Pulmonary effort is normal. No respiratory distress.      Breath sounds: Normal breath sounds.  Feet:     Comments: Patient has swelling and erythema present to medial/dorsal surface of left pinky toe.  Swelling extends halfway across dorsal surface of toe but does not extend into the side of the toe.  No purulent drainage noted.  There is a purulent discoloration with the swelling. No fluctuance. No nail involvement.  Capillary refill and pulses intact.  Patient can wiggle toe.  No tenderness to foot or extension into foot. Neurological:     General: No focal deficit present.     Mental Status: She is alert and oriented to person, place, and time. Mental status is at baseline.  Psychiatric:        Mood and Affect: Mood normal.        Behavior: Behavior normal.        Thought Content: Thought content normal.        Judgment: Judgment normal.      UC Treatments / Results  Labs (all labs ordered are listed, but only abnormal results are displayed) Labs Reviewed - No data to display  EKG   Radiology No results found.  Procedures Procedures (including critical care time)  Medications Ordered in UC Medications - No data to display  Initial Impression / Assessment and Plan / UC Course  I have reviewed the triage vital signs and the nursing notes.  Pertinent labs & imaging results that were available during my care of the patient were reviewed by me and considered in my medical decision making (see chart for details).     Physical exam is consistent with infection of the toe.  Will treat with doxycycline.  Patient advised to use warm Epsom salt soaks as well.  No I&D necessary at this time given appearance on physical exam.  Although, patient was advised to follow-up if no improvement in the next 48 to 72 hours with antibiotic therapy.  She was also advised to follow-up with PCP.  No concern for septic joint.  Patient has mildly elevated blood pressure reading today.  She denies history of hypertension.  Suspect pain could be contributing.   Recheck was similar.  Patient is asymptomatic and no concerns for hypertensive urgency.  Patient advised to monitor with home blood pressure cuff and follow-up PCP or urgent care if remains elevated.  Patient given strict return and ER precautions.  Patient verbalized understanding and was agreeable with plan.  Final Clinical Impressions(s) / UC Diagnoses   Final diagnoses:  Toe infection     Discharge Instructions      You have an infection of your toe.  Recommend warm Epsom salt soaks and I am sending an antibiotic.  Take this medication with food to avoid nausea.  Follow-up if no improvement in symptoms in the next 48 to 72 hours or if it worsens.    ED Prescriptions     Medication Sig Dispense Auth. Provider   doxycycline (VIBRAMYCIN) 100 MG capsule Take 1 capsule (100 mg total) by mouth 2 (two) times daily. 20 capsule Gustavus Bryant, [place]      PDMP not reviewed this encounter.   Gustavus Bryant, [place] 04/16/22 2011

## 2022-04-16 NOTE — Discharge Instructions (Signed)
You have an infection of your toe.  Recommend warm Epsom salt soaks and I am sending an antibiotic.  Take this medication with food to avoid nausea.  Follow-up if no improvement in symptoms in the next 48 to 72 hours or if it worsens.

## 2024-04-06 ENCOUNTER — Ambulatory Visit
Admission: EM | Admit: 2024-04-06 | Discharge: 2024-04-06 | Disposition: A | Discharge status: Home / Self Care | Payer: Self-pay | Attending: Family Medicine | Admitting: Family Medicine

## 2024-04-06 ENCOUNTER — Encounter: Payer: Self-pay | Admitting: Internal Medicine

## 2024-04-06 ENCOUNTER — Ambulatory Visit: Payer: Self-pay

## 2024-04-06 DIAGNOSIS — R062 Wheezing: Secondary | ICD-10-CM

## 2024-04-06 DIAGNOSIS — R051 Acute cough: Secondary | ICD-10-CM

## 2024-04-06 DIAGNOSIS — R0602 Shortness of breath: Secondary | ICD-10-CM

## 2024-04-06 DIAGNOSIS — J189 Pneumonia, unspecified organism: Secondary | ICD-10-CM

## 2024-04-06 MED ORDER — PREDNISONE 20 MG PO TABS: 40.0000 mg | ORAL_TABLET | Freq: Every day | ORAL | 0 refills | Status: AC

## 2024-04-06 MED ORDER — KETOROLAC TROMETHAMINE 30 MG/ML IJ SOLN
30.0000 mg | Freq: Once | INTRAMUSCULAR | Status: AC
  Administered 2024-04-06: 30 mg via INTRAMUSCULAR

## 2024-04-06 MED ORDER — ALBUTEROL SULFATE HFA 108 (90 BASE) MCG/ACT IN AERS: 1.0000 | INHALATION_SPRAY | Freq: Four times a day (QID) | RESPIRATORY_TRACT | 0 refills | Status: AC | PRN

## 2024-04-06 MED ORDER — ONDANSETRON 4 MG PO TBDP
4.0000 mg | ORAL_TABLET | Freq: Once | ORAL | Status: AC
  Administered 2024-04-06: 4 mg via ORAL

## 2024-04-06 MED ORDER — ONDANSETRON 4 MG PO TBDP: 4.0000 mg | ORAL_TABLET | Freq: Three times a day (TID) | ORAL | 0 refills | Status: AC | PRN

## 2024-04-06 MED ORDER — AEROCHAMBER PLUS FLO-VU MEDIUM MISC
1.0000 | Freq: Once | Status: AC
  Administered 2024-04-06: 1

## 2024-04-06 MED ORDER — METHYLPREDNISOLONE SODIUM SUCC 125 MG IJ SOLR
80.0000 mg | Freq: Once | INTRAMUSCULAR | Status: AC
  Administered 2024-04-06: 80 mg via INTRAMUSCULAR

## 2024-04-06 MED ORDER — AZITHROMYCIN 250 MG PO TABS: ORAL_TABLET | ORAL | 0 refills | Status: DC

## 2024-04-06 MED ORDER — FLUCONAZOLE 150 MG PO TABS: 150.0000 mg | ORAL_TABLET | Freq: Every day | ORAL | 0 refills | Status: AC

## 2024-04-06 MED ORDER — AMOXICILLIN-POT CLAVULANATE 875-125 MG PO TABS: 1.0000 | ORAL_TABLET | Freq: Two times a day (BID) | ORAL | 0 refills | Status: DC

## 2024-04-06 MED ORDER — BENZONATATE 100 MG PO CAPS: 100.0000 mg | ORAL_CAPSULE | Freq: Three times a day (TID) | ORAL | 0 refills | Status: AC

## 2024-04-06 NOTE — ED Triage Notes (Signed)
 Pt st's she has had a productive cough (with blood) since last night.  Pt st's now having severe right upper rib pain   Breath sounds equal through out

## 2024-04-06 NOTE — ED Provider Notes (Signed)
 [facility]    CSN: [id number] Arrival date & time: 04/06/24  1434      History   Chief Complaint Chief Complaint  Patient presents with   Rib pain    HPI Julie Smith is a 48 y.o. female.   48 year old female presents urgent care with complaints of coughing that has become productive with some blood tinge, right rib pain from coughing, general fatigue, wheezing, shortness of breath.  She reports her symptoms started on Friday.  She has been coughing so much that she has started to have significant pain especially on the right rib.  She noted coughing last night she had a horrible fit in which the right side began to hurt significantly and she noted blood-tinged sputum.  She is having a lot of pain especially with deep breathing and with coughing.  She has not been around any known sick contacts and wears a mask when she has at work.     Past Medical History:  Diagnosis Date   Hypertension     Patient Active Problem List   Diagnosis Date Noted   Hypertension    Near syncope 06/21/2013    Past Surgical History:  Procedure Laterality Date   ECTOPIC PREGNANCY SURGERY     SKIN BIOPSY      OB History   No obstetric history on file.      Home Medications    Prior to Admission medications   Medication Sig Start Date End Date Taking? Authorizing Provider  albuterol (VENTOLIN HFA) 108 (90 Base) MCG/ACT inhaler Inhale 1-2 puffs into the lungs every 6 (six) hours as needed for wheezing or shortness of breath. 04/06/24  Yes Donelle Baba A, PA-C  amoxicillin -clavulanate (AUGMENTIN) 875-125 MG tablet Take 1 tablet by mouth every 12 (twelve) hours for 5 days. 04/06/24 04/11/24 Yes Marcell Chavarin A, PA-C  azithromycin (ZITHROMAX) 250 MG tablet Take first 2 tablets together, then 1 every day until finished. 04/06/24  Yes Kenard Morawski A, PA-C  benzonatate (TESSALON) 100 MG capsule Take 1 capsule (100 mg total) by mouth every 8 (eight) hours. 04/06/24  Yes  Buel Molder A, PA-C  fluconazole (DIFLUCAN) 150 MG tablet Take 1 tablet (150 mg total) by mouth daily for 2 days. take 1 tablet on day 2 of antibiotics and then repeat in 3 days 04/06/24 04/08/24 Yes Ishmel Acevedo A, PA-C  ondansetron  (ZOFRAN -ODT) 4 MG disintegrating tablet Take 1 tablet (4 mg total) by mouth every 8 (eight) hours as needed for nausea or vomiting. 04/06/24  Yes Ezequiel Macauley A, PA-C  predniSONE (DELTASONE) 20 MG tablet Take 2 tablets (40 mg total) by mouth daily with breakfast for 4 days. 04/06/24 04/10/24 Yes Caldonia Leap A, PA-C  buPROPion  (WELLBUTRIN  SR) 150 MG 12 hr tablet Take 1 tablet (150 mg total) by mouth 2 (two) times daily. For Smoking cessation Patient not taking: Reported on 04/16/2022 07/12/19   Newlin, Enobong, MD  hydrochlorothiazide  (HYDRODIURIL ) 25 MG tablet Take 1 tablet (25 mg total) by mouth daily. Patient not taking: Reported on 04/16/2022 07/12/19   Newlin, Enobong, MD  hydrOXYzine  (ATARAX /VISTARIL ) 25 MG tablet Take 1 tablet (25 mg total) by mouth 3 (three) times daily as needed. Patient not taking: Reported on 04/16/2022 05/31/19   Newlin, Enobong, MD  ibuprofen  (ADVIL ,MOTRIN ) 200 MG tablet Take 800 mg by mouth every 6 (six) hours as needed.    [provider]  potassium chloride  (KLOR-CON  10) 10 MEQ tablet Take 1 tablet (10 mEq total) by mouth  daily. Patient not taking: Reported on 04/16/2022 07/12/19   Newlin, Enobong, MD    Family History History reviewed. No pertinent family history.  Social History Social History   Tobacco Use   Smoking status: Every Day    Current packs/day: 0.50    Average packs/day: 0.5 packs/day for 20.0 years (10.0 ttl pk-yrs)    Types: Cigarettes   Smokeless tobacco: Never  Substance Use Topics   Alcohol use: Yes    Comment: occasional   Drug use: No     Allergies   Codeine   Review of Systems Review of Systems  Constitutional:  Positive for fatigue. Negative for chills and fever.  HENT:   Negative for ear pain and sore throat.   Eyes:  Negative for pain and visual disturbance.  Respiratory:  Positive for cough, shortness of breath and wheezing.   Cardiovascular:  Negative for chest pain and palpitations.  Gastrointestinal:  Negative for abdominal pain and vomiting.  Genitourinary:  Negative for dysuria and hematuria.  Musculoskeletal:  Negative for arthralgias and back pain.  Skin:  Negative for color change and rash.  Neurological:  Negative for seizures and syncope.  All other systems reviewed and are negative.    Physical Exam Triage Vital Signs ED Triage Vitals  Encounter Vitals Group     BP      Girls Systolic BP Percentile      Girls Diastolic BP Percentile      Boys Systolic BP Percentile      Boys Diastolic BP Percentile      Pulse      Resp      Temp      Temp src      SpO2      Weight      Height      Head Circumference      Peak Flow      Pain Score      Pain Loc      Pain Education      Exclude from Growth Chart    No data found.  Updated Vital Signs BP 139/80 (BP Location: Left Arm)   Pulse 100   Temp 98.3 F (36.8 C)   Resp 17   Ht 5' 10 (1.778 m)   Wt 170 lb (77.1 kg)   LMP 04/06/2024 (Exact Date)   SpO2 97%   BMI 24.39 kg/m   Visual Acuity Right Eye Distance:   Left Eye Distance:   Bilateral Distance:    Right Eye Near:   Left Eye Near:    Bilateral Near:     Physical Exam Vitals and nursing note reviewed.  Constitutional:      General: She is not in acute distress.    Appearance: She is well-developed.  HENT:     Head: Normocephalic and atraumatic.     Right Ear: A middle ear effusion is present. Tympanic membrane is erythematous.     Left Ear: A middle ear effusion is present. Tympanic membrane is not erythematous.  Eyes:     Conjunctiva/sclera: Conjunctivae normal.  Cardiovascular:     Rate and Rhythm: Normal rate and regular rhythm.     Heart sounds: No murmur heard. Pulmonary:     Effort: Pulmonary  effort is normal. No respiratory distress.     Breath sounds: Examination of the right-upper field reveals wheezing. Examination of the left-upper field reveals wheezing. Examination of the right-middle field reveals wheezing. Examination of the left-middle field reveals wheezing. Examination of the  right-lower field reveals wheezing. Examination of the left-lower field reveals wheezing. Wheezing present. No decreased breath sounds.  Chest:    Abdominal:     Palpations: Abdomen is soft.     Tenderness: There is no abdominal tenderness.  Musculoskeletal:        General: No swelling.     Cervical back: Neck supple.  Skin:    General: Skin is warm and dry.     Capillary Refill: Capillary refill takes less than 2 seconds.  Neurological:     Mental Status: She is alert.  Psychiatric:        Mood and Affect: Mood normal.      UC Treatments / Results  Labs (all labs ordered are listed, but only abnormal results are displayed) Labs Reviewed - No data to display  EKG   Radiology DG Chest 2 View Result Date: 04/06/2024 CLINICAL DATA:  Hemoptysis and right upper rib pain. EXAM: CHEST - 2 VIEW COMPARISON:  September 07, 2019 FINDINGS: The heart size and mediastinal contours are within normal limits. There is moderate to marked severity inferolateral right upper lobe infiltrate. No pleural effusion or pneumothorax is identified. The visualized skeletal structures are unremarkable. IMPRESSION: Moderate to marked severity right upper lobe infiltrate. Follow-up to resolution is recommended to exclude the presence of an underlying neoplastic process. Electronically Signed   By: Suzen Dials M.D.   On: 04/06/2024 17:06    Procedures Procedures (including critical care time)  Medications Ordered in UC Medications  ketorolac (TORADOL) 30 MG/ML injection 30 mg (30 mg Intramuscular Given 04/06/24 1654)  methylPREDNISolone sodium succinate (SOLU-MEDROL) 125 mg/2 mL injection 80 mg (80 mg  Intramuscular Given 04/06/24 1656)  ondansetron  (ZOFRAN -ODT) disintegrating tablet 4 mg (4 mg Oral Given 04/06/24 1657)    Initial Impression / Assessment and Plan / UC Course  I have reviewed the triage vital signs and the nursing notes.  Pertinent labs & imaging results that were available during my care of the patient were reviewed by me and considered in my medical decision making (see chart for details).     Pneumonia of right middle lobe due to infectious organism  Shortness of breath - Plan: DG Chest 2 View, DG Chest 2 View  Acute cough - Plan: DG Chest 2 View, DG Chest 2 View  Wheezing - Plan: DG Chest 2 View, DG Chest 2 View   Chest x-ray done today.  Final evaluation by the radiologist does show a right middle lobe pneumonia.  This will need to be treated with antibiotics as well as medication to improve cough, airway inflammation and breathing.  We will treat with the following: Azithromycin 250mg  Take 2 tablets today and the 1 tablet daily for 4 more days.  Augmentin 875 mg twice daily for 5 days.  This is an antibiotic.  Take this with food.  Prednisone 40 mg (2 tablets) once daily for 4 days. Take this in the morning.  This is a steroid to help with inflammation and pain.  Do not take ibuprofen  while you are taking this medication.  It is okay to use Tylenol. Diflucan 150 mg take 1 tablet after 2 days on antibiotics and then repeat in 3 days.  Albuterol inhaler 1-2 puffs every 6 hours as needed for wheezing/shortness of breath.  Benzonatate (tessalon) 100 mg every 8 hours as needed for cough.   Zofran  4 mg orally disintegrating tablet every 8 hours as needed for nausea.  Make sure to stay hydrated by drinking plenty  of water.  Return to urgent care or your primary care provider in 2 to 3 weeks for repeat chest x-ray Return to urgent care or PCP if symptoms worsen or fail to resolve.    Final Clinical Impressions(s) / UC Diagnoses   Final diagnoses:  Shortness of breath   Acute cough  Wheezing  Pneumonia of right middle lobe due to infectious organism     Discharge Instructions      Chest x-ray done today.  Final evaluation by the radiologist does show a right middle lobe pneumonia.  This will need to be treated with antibiotics as well as medication to improve cough, airway inflammation and breathing.  We will treat with the following: Azithromycin 250mg  Take 2 tablets today and the 1 tablet daily for 4 more days.  Augmentin 875 mg twice daily for 5 days.  This is an antibiotic.  Take this with food.  Prednisone 40 mg (2 tablets) once daily for 4 days. Take this in the morning.  This is a steroid to help with inflammation and pain.  Do not take ibuprofen  while you are taking this medication.  It is okay to use Tylenol. Diflucan 150 mg take 1 tablet after 2 days on antibiotics and then repeat in 3 days.  Albuterol inhaler 1-2 puffs every 6 hours as needed for wheezing/shortness of breath.  Benzonatate (tessalon) 100 mg every 8 hours as needed for cough.   Zofran  4 mg orally disintegrating tablet every 8 hours as needed for nausea.  Make sure to stay hydrated by drinking plenty of water.  Return to urgent care or your primary care provider in 2 to 3 weeks for repeat chest x-ray Return to urgent care or PCP if symptoms worsen or fail to resolve.       ED Prescriptions     Medication Sig Dispense Auth. Provider   ondansetron  (ZOFRAN -ODT) 4 MG disintegrating tablet Take 1 tablet (4 mg total) by mouth every 8 (eight) hours as needed for nausea or vomiting. 20 tablet Teresa Norris A, PA-C   amoxicillin -clavulanate (AUGMENTIN) 875-125 MG tablet Take 1 tablet by mouth every 12 (twelve) hours for 5 days. 10 tablet Elvis Boot A, PA-C   azithromycin (ZITHROMAX) 250 MG tablet Take first 2 tablets together, then 1 every day until finished. 6 tablet Teresa Norris A, PA-C   fluconazole (DIFLUCAN) 150 MG tablet Take 1 tablet (150 mg total) by mouth daily  for 2 days. take 1 tablet on day 2 of antibiotics and then repeat in 3 days 2 tablet Langdon Crosson A, PA-C   albuterol (VENTOLIN HFA) 108 (90 Base) MCG/ACT inhaler Inhale 1-2 puffs into the lungs every 6 (six) hours as needed for wheezing or shortness of breath. 6.7 g Merlin Ege A, PA-C   predniSONE (DELTASONE) 20 MG tablet Take 2 tablets (40 mg total) by mouth daily with breakfast for 4 days. 8 tablet Kaydenn Mclear A, PA-C   benzonatate (TESSALON) 100 MG capsule Take 1 capsule (100 mg total) by mouth every 8 (eight) hours. 21 capsule Teresa Norris LABOR, [place]      PDMP not reviewed this encounter.   Teresa Norris LABOR, [place] 04/06/24 1717

## 2024-04-06 NOTE — Discharge Instructions (Addendum)
 Chest x-ray done today.  Final evaluation by the radiologist does show a right middle lobe pneumonia.  This will need to be treated with antibiotics as well as medication to improve cough, airway inflammation and breathing.  We will treat with the following: Azithromycin 250mg  Take 2 tablets today and the 1 tablet daily for 4 more days.  Augmentin 875 mg twice daily for 5 days.  This is an antibiotic.  Take this with food.  Prednisone 40 mg (2 tablets) once daily for 4 days. Take this in the morning.  This is a steroid to help with inflammation and pain.  Do not take ibuprofen  while you are taking this medication.  It is okay to use Tylenol. Diflucan 150 mg take 1 tablet after 2 days on antibiotics and then repeat in 3 days.  Albuterol inhaler 1-2 puffs every 6 hours as needed for wheezing/shortness of breath.  Benzonatate (tessalon) 100 mg every 8 hours as needed for cough.   Zofran  4 mg orally disintegrating tablet every 8 hours as needed for nausea.  Make sure to stay hydrated by drinking plenty of water.  Return to urgent care or your primary care provider in 2 to 3 weeks for repeat chest x-ray Return to urgent care or PCP if symptoms worsen or fail to resolve.

## 2024-04-11 ENCOUNTER — Emergency Department (HOSPITAL_COMMUNITY): Payer: Self-pay

## 2024-04-11 ENCOUNTER — Other Ambulatory Visit: Payer: Self-pay

## 2024-04-11 ENCOUNTER — Inpatient Hospital Stay (HOSPITAL_COMMUNITY)
Admission: EM | Admit: 2024-04-11 | Discharge: 2024-04-15 | DRG: 178 | Disposition: A | Discharge status: Home / Self Care | Payer: MEDICAID | Attending: Internal Medicine | Admitting: Internal Medicine

## 2024-04-11 ENCOUNTER — Encounter (HOSPITAL_COMMUNITY): Payer: Self-pay

## 2024-04-11 DIAGNOSIS — E876 Hypokalemia: Secondary | ICD-10-CM | POA: Diagnosis present

## 2024-04-11 DIAGNOSIS — J189 Pneumonia, unspecified organism: Secondary | ICD-10-CM | POA: Diagnosis present

## 2024-04-11 DIAGNOSIS — I10 Essential (primary) hypertension: Secondary | ICD-10-CM | POA: Diagnosis present

## 2024-04-11 DIAGNOSIS — Z1152 Encounter for screening for COVID-19: Secondary | ICD-10-CM

## 2024-04-11 DIAGNOSIS — J188 Other pneumonia, unspecified organism: Secondary | ICD-10-CM | POA: Diagnosis not present

## 2024-04-11 DIAGNOSIS — J15212 Pneumonia due to Methicillin resistant Staphylococcus aureus: Principal | ICD-10-CM | POA: Diagnosis present

## 2024-04-11 DIAGNOSIS — Z885 Allergy status to narcotic agent status: Secondary | ICD-10-CM

## 2024-04-11 DIAGNOSIS — E871 Hypo-osmolality and hyponatremia: Secondary | ICD-10-CM | POA: Diagnosis present

## 2024-04-11 DIAGNOSIS — Z79899 Other long term (current) drug therapy: Secondary | ICD-10-CM

## 2024-04-11 DIAGNOSIS — R5381 Other malaise: Secondary | ICD-10-CM | POA: Diagnosis present

## 2024-04-11 DIAGNOSIS — A419 Sepsis, unspecified organism: Principal | ICD-10-CM

## 2024-04-11 DIAGNOSIS — F17210 Nicotine dependence, cigarettes, uncomplicated: Secondary | ICD-10-CM | POA: Diagnosis present

## 2024-04-11 LAB — CBC WITH DIFFERENTIAL/PLATELET
Abs Immature Granulocytes: 0.16 K/uL — ABNORMAL HIGH (ref 0.00–0.07)
Basophils Absolute: 0.1 K/uL (ref 0.0–0.1)
Basophils Relative: 0 %
Eosinophils Absolute: 0.1 K/uL (ref 0.0–0.5)
Eosinophils Relative: 0 %
HCT: 37.8 % (ref 36.0–46.0)
Hemoglobin: 13 g/dL (ref 12.0–15.0)
Immature Granulocytes: 1 %
Lymphocytes Relative: 13 %
Lymphs Abs: 2.2 K/uL (ref 0.7–4.0)
MCH: 31.9 pg (ref 26.0–34.0)
MCHC: 34.4 g/dL (ref 30.0–36.0)
MCV: 92.9 fL (ref 80.0–100.0)
Monocytes Absolute: 1.8 K/uL — ABNORMAL HIGH (ref 0.1–1.0)
Monocytes Relative: 11 %
Neutro Abs: 12.6 K/uL — ABNORMAL HIGH (ref 1.7–7.7)
Neutrophils Relative %: 75 %
Platelets: 331 K/uL (ref 150–400)
RBC: 4.07 MIL/uL (ref 3.87–5.11)
RDW: 11.7 % (ref 11.5–15.5)
WBC: 16.9 K/uL — ABNORMAL HIGH (ref 4.0–10.5)
nRBC: 0.1 % (ref 0.0–0.2)

## 2024-04-11 LAB — COMPREHENSIVE METABOLIC PANEL WITH GFR
ALT: 36 U/L (ref 0–44)
AST: 18 U/L (ref 15–41)
Albumin: 3.5 g/dL (ref 3.5–5.0)
Alkaline Phosphatase: 103 U/L (ref 38–126)
Anion gap: 13 (ref 5–15)
BUN: 6 mg/dL (ref 6–20)
CO2: 29 mmol/L (ref 22–32)
Calcium: 9.1 mg/dL (ref 8.9–10.3)
Chloride: 95 mmol/L — ABNORMAL LOW (ref 98–111)
Creatinine, Ser: 0.67 mg/dL (ref 0.44–1.00)
GFR, Estimated: >60 mL/min (ref >60)
Glucose, Bld: 110 mg/dL — ABNORMAL HIGH (ref 70–99)
Potassium: 2.4 mmol/L — CL (ref 3.5–5.1)
Sodium: 137 mmol/L (ref 135–145)
Total Bilirubin: 1 mg/dL (ref 0.0–1.2)
Total Protein: 7.4 g/dL (ref 6.5–8.1)

## 2024-04-11 LAB — HCG, SERUM, QUALITATIVE: Preg, Serum: NEGATIVE

## 2024-04-11 LAB — RESP PANEL BY RT-PCR (RSV, FLU A&B, COVID)  RVPGX2
Influenza A by PCR: NEGATIVE
Influenza B by PCR: NEGATIVE
Resp Syncytial Virus by PCR: NEGATIVE
SARS Coronavirus 2 by RT PCR: NEGATIVE

## 2024-04-11 LAB — URINALYSIS, W/ REFLEX TO CULTURE (INFECTION SUSPECTED)
Bacteria, UA: NONE SEEN
Bilirubin Urine: NEGATIVE
Glucose, UA: NEGATIVE mg/dL
Ketones, ur: NEGATIVE mg/dL
Leukocytes,Ua: NEGATIVE
Nitrite: NEGATIVE
Protein, ur: NEGATIVE mg/dL
Specific Gravity, Urine: 1.009 (ref 1.005–1.030)
pH: 6 (ref 5.0–8.0)

## 2024-04-11 LAB — MAGNESIUM: Magnesium: 2.1 mg/dL (ref 1.7–2.4)

## 2024-04-11 LAB — I-STAT CG4 LACTIC ACID, ED: Lactic Acid, Venous: 0.7 mmol/L (ref 0.5–1.9)

## 2024-04-11 LAB — PROCALCITONIN: Procalcitonin: 0.14 ng/mL

## 2024-04-11 LAB — TROPONIN T, HIGH SENSITIVITY: Troponin T High Sensitivity: <15 ng/L (ref 0–19)

## 2024-04-11 LAB — PROTIME-INR
INR: 1.3 — ABNORMAL HIGH (ref 0.8–1.2)
Prothrombin Time: 16.7 s — ABNORMAL HIGH (ref 11.4–15.2)

## 2024-04-11 MED ORDER — HYDROMORPHONE HCL 1 MG/ML IJ SOLN
0.5000 mg | INTRAMUSCULAR | Status: DC | PRN
  Administered 2024-04-12 – 2024-04-15 (×10): 0.5 mg via INTRAVENOUS
  Filled 2024-04-11 (×10): qty 0.5

## 2024-04-11 MED ORDER — LACTATED RINGERS IV SOLN: INTRAVENOUS | Status: AC

## 2024-04-11 MED ORDER — LACTATED RINGERS IV BOLUS (SEPSIS)
1000.0000 mL | Freq: Once | INTRAVENOUS | Status: AC
  Administered 2024-04-11: 1000 mL via INTRAVENOUS

## 2024-04-11 MED ORDER — PROCHLORPERAZINE EDISYLATE 10 MG/2ML IJ SOLN: 5.0000 mg | Freq: Four times a day (QID) | INTRAMUSCULAR | Status: DC | PRN

## 2024-04-11 MED ORDER — POLYETHYLENE GLYCOL 3350 17 G PO PACK: 17.0000 g | PACK | Freq: Every day | ORAL | Status: DC | PRN

## 2024-04-11 MED ORDER — POTASSIUM CHLORIDE 10 MEQ/100ML IV SOLN
10.0000 meq | Freq: Once | INTRAVENOUS | Status: AC
  Administered 2024-04-11: 10 meq via INTRAVENOUS
  Filled 2024-04-11: qty 100

## 2024-04-11 MED ORDER — ACETAMINOPHEN 325 MG PO TABS
650.0000 mg | ORAL_TABLET | Freq: Four times a day (QID) | ORAL | Status: DC | PRN
  Administered 2024-04-12 (×2): 650 mg via ORAL
  Filled 2024-04-11 (×3): qty 2

## 2024-04-11 MED ORDER — SODIUM CHLORIDE 0.9 % IV SOLN
100.0000 mg | Freq: Two times a day (BID) | INTRAVENOUS | Status: DC
  Administered 2024-04-11 – 2024-04-12 (×2): 100 mg via INTRAVENOUS
  Filled 2024-04-11 (×2): qty 100

## 2024-04-11 MED ORDER — VANCOMYCIN HCL 1250 MG/250ML IV SOLN
1250.0000 mg | Freq: Two times a day (BID) | INTRAVENOUS | Status: DC
  Administered 2024-04-12: 1250 mg via INTRAVENOUS
  Filled 2024-04-11: qty 250

## 2024-04-11 MED ORDER — LACTATED RINGERS IV SOLN: INTRAVENOUS | Status: DC

## 2024-04-11 MED ORDER — ACETAMINOPHEN 325 MG PO TABS
650.0000 mg | ORAL_TABLET | Freq: Once | ORAL | Status: AC | PRN
  Administered 2024-04-11: 650 mg via ORAL
  Filled 2024-04-11: qty 2

## 2024-04-11 MED ORDER — VANCOMYCIN HCL IN DEXTROSE 1-5 GM/200ML-% IV SOLN
1000.0000 mg | Freq: Once | INTRAVENOUS | Status: AC
  Administered 2024-04-11: 1000 mg via INTRAVENOUS
  Filled 2024-04-11: qty 200

## 2024-04-11 MED ORDER — SODIUM CHLORIDE 0.9% FLUSH
3.0000 mL | Freq: Two times a day (BID) | INTRAVENOUS | Status: DC
  Administered 2024-04-11 – 2024-04-15 (×7): 3 mL via INTRAVENOUS

## 2024-04-11 MED ORDER — OXYCODONE HCL 5 MG PO TABS
5.0000 mg | ORAL_TABLET | ORAL | Status: DC | PRN
  Administered 2024-04-13: 5 mg via ORAL
  Filled 2024-04-11 (×2): qty 1

## 2024-04-11 MED ORDER — GUAIFENESIN 100 MG/5ML PO LIQD
5.0000 mL | ORAL | Status: DC | PRN
  Administered 2024-04-11 – 2024-04-15 (×11): 5 mL via ORAL
  Filled 2024-04-11 (×11): qty 10

## 2024-04-11 MED ORDER — SODIUM CHLORIDE 0.9 % IV SOLN
2.0000 g | INTRAVENOUS | Status: DC
  Administered 2024-04-11 – 2024-04-13 (×3): 2 g via INTRAVENOUS
  Filled 2024-04-11 (×3): qty 20

## 2024-04-11 MED ORDER — IOHEXOL 350 MG/ML SOLN
75.0000 mL | Freq: Once | INTRAVENOUS | Status: AC | PRN
  Administered 2024-04-11: 75 mL via INTRAVENOUS

## 2024-04-11 MED ORDER — BISACODYL 5 MG PO TBEC: 5.0000 mg | DELAYED_RELEASE_TABLET | Freq: Every day | ORAL | Status: DC | PRN

## 2024-04-11 MED ORDER — SODIUM CHLORIDE 0.9 % IV SOLN
2.0000 g | Freq: Once | INTRAVENOUS | Status: AC
  Administered 2024-04-11: 2 g via INTRAVENOUS
  Filled 2024-04-11: qty 12.5

## 2024-04-11 MED ORDER — SODIUM CHLORIDE 0.9 % IV SOLN: 500.0000 mg | Freq: Once | INTRAVENOUS | Status: DC

## 2024-04-11 MED ORDER — POTASSIUM CHLORIDE CRYS ER 20 MEQ PO TBCR
40.0000 meq | EXTENDED_RELEASE_TABLET | Freq: Once | ORAL | Status: AC
  Administered 2024-04-11: 40 meq via ORAL
  Filled 2024-04-11: qty 2

## 2024-04-11 MED ORDER — SODIUM CHLORIDE 0.9 % IV SOLN: 2.0000 g | Freq: Once | INTRAVENOUS | Status: DC

## 2024-04-11 MED ORDER — ACETAMINOPHEN 650 MG RE SUPP: 650.0000 mg | Freq: Four times a day (QID) | RECTAL | Status: DC | PRN

## 2024-04-11 MED ORDER — ACETAMINOPHEN 325 MG PO TABS
650.0000 mg | ORAL_TABLET | Freq: Once | ORAL | Status: AC
  Administered 2024-04-11: 650 mg via ORAL
  Filled 2024-04-11: qty 2

## 2024-04-11 MED ORDER — LACTATED RINGERS IV BOLUS (SEPSIS)
500.0000 mL | Freq: Once | INTRAVENOUS | Status: AC
  Administered 2024-04-11: 500 mL via INTRAVENOUS

## 2024-04-11 MED ORDER — POTASSIUM CHLORIDE 10 MEQ/100ML IV SOLN
10.0000 meq | INTRAVENOUS | Status: AC
  Administered 2024-04-11 (×2): 10 meq via INTRAVENOUS
  Filled 2024-04-11 (×2): qty 100

## 2024-04-11 MED ORDER — ENOXAPARIN SODIUM 40 MG/0.4ML IJ SOSY
40.0000 mg | PREFILLED_SYRINGE | INTRAMUSCULAR | Status: DC
  Administered 2024-04-11 – 2024-04-14 (×4): 40 mg via SUBCUTANEOUS
  Filled 2024-04-11 (×4): qty 0.4

## 2024-04-11 NOTE — Progress Notes (Signed)
 Pharmacy Antibiotic Note  Julie Smith is a 48 y.o. female admitted on 04/11/2024 with cavitary pneumonia.  Pharmacy has been consulted for vancomycin  dosing.  In ED, vancomycin  1000 mg IV x 1 administered.  Plan: Rocephin  2 g IV q24h and doxycycline  100 mg IV q12h per provider Continue vancomycin  at 1250 mg IV q12h (Goal AUC 400-550, eAUC 546.4, SCr used: rounded up to 0.8) Monitor clinical progress, renal function, vancomycin  levels as indicated F/U C&S, abx deescalation / LOT      Temp (24hrs), Avg:101.5 F (38.6 C), Min:100.3 F (37.9 C), Max:102.7 F (39.3 C)  Recent Labs  Lab 04/11/24 1530 04/11/24 1535  WBC 16.9*  --   CREATININE 0.67  --   LATICACIDVEN  --  0.7    Estimated Creatinine Clearance: 94 mL/min (by C-G formula based on SCr of 0.67 mg/dL).    Allergies  Allergen Reactions   Codeine Other (See Comments)    Hallucinations     Antimicrobials this admission: 11/23 Rocephin  >>  11/23 doxycycline  >>  11/23 vancomycin  >>  Microbiology results: 11/23 BCx: pending 11/23 MRSA PCR: ordered 11/23 AFB smears/culture: ordered 11/23 fungitell: ordered   Thank you for allowing pharmacy to be a part of this patient's care.  Eleanor Agent, PharmD, BCPS 04/11/2024 9:03 PM

## 2024-04-11 NOTE — ED Provider Notes (Signed)
 [place] EMERGENCY DEPARTMENT AT [facility] Provider Note   CSN: [id number] Arrival date & time: 04/11/24  1449    Patient presents with: Shortness of Breath   Julie Smith is a 48 y.o. female for evaluation of cough, chest pain, shortness of breath.  Diagnosed with pneumonia 04/06/24 at urgent care.  Given Augmentin  and azithromycin .  She denies any nausea, vomiting or concerns for aspiration.  She completed the antibiotics.  She states she still running fevers up to 103 at home.  She is not having hemoptysis.  She has right sided chest pain.  No history of PE or DVT.  No pain or swelling to lower legs.  Has states she has not gotten up and moved very much over the last week due to feeling unwell.  No sick contacts.  She feels like she has been wheezing at home.  Chest pain worse with deep breathing, coughing.  Wears a mask when she is at work.    HPI     Prior to Admission medications   Medication Sig Start Date End Date Taking? Authorizing Provider  albuterol  (VENTOLIN  HFA) 108 (90 Base) MCG/ACT inhaler Inhale 1-2 puffs into the lungs every 6 (six) hours as needed for wheezing or shortness of breath. 04/06/24   White, Elizabeth A, PA-C  amoxicillin -clavulanate (AUGMENTIN ) 875-125 MG tablet Take 1 tablet by mouth every 12 (twelve) hours for 5 days. 04/06/24 04/11/24  Teresa Norris A, PA-C  azithromycin  (ZITHROMAX ) 250 MG tablet Take first 2 tablets together, then 1 every day until finished. 04/06/24   White, Elizabeth A, PA-C  benzonatate  (TESSALON ) 100 MG capsule Take 1 capsule (100 mg total) by mouth every 8 (eight) hours. 04/06/24   White, Elizabeth A, PA-C  buPROPion  (WELLBUTRIN  SR) 150 MG 12 hr tablet Take 1 tablet (150 mg total) by mouth 2 (two) times daily. For Smoking cessation Patient not taking: Reported on 04/16/2022 07/12/19   Newlin, Enobong, MD  hydrochlorothiazide  (HYDRODIURIL ) 25 MG tablet Take 1 tablet (25 mg total) by mouth daily. Patient not taking:  Reported on 04/16/2022 07/12/19   Newlin, Enobong, MD  hydrOXYzine  (ATARAX /VISTARIL ) 25 MG tablet Take 1 tablet (25 mg total) by mouth 3 (three) times daily as needed. Patient not taking: Reported on 04/16/2022 05/31/19   Newlin, Enobong, MD  ibuprofen  (ADVIL ,MOTRIN ) 200 MG tablet Take 800 mg by mouth every 6 (six) hours as needed.    [provider]  ondansetron  (ZOFRAN -ODT) 4 MG disintegrating tablet Take 1 tablet (4 mg total) by mouth every 8 (eight) hours as needed for nausea or vomiting. 04/06/24   White, Elizabeth A, PA-C  potassium chloride  (KLOR-CON  10) 10 MEQ tablet Take 1 tablet (10 mEq total) by mouth daily. Patient not taking: Reported on 04/16/2022 07/12/19   Newlin, Enobong, MD    Allergies: Codeine    Review of Systems  Constitutional: Negative.   HENT: Negative.    Respiratory:  Positive for cough and shortness of breath.   Cardiovascular:  Positive for chest pain. Negative for palpitations and leg swelling.  Gastrointestinal: Negative.   Genitourinary: Negative.   Musculoskeletal: Negative.   Skin: Negative.   Neurological: Negative.   All other systems reviewed and are negative.   Updated Vital Signs BP (!) 147/98 (BP Location: Left Arm)   Pulse 100   Temp (!) 102.7 F (39.3 C) (Oral)   Resp (!) 22   LMP 04/06/2024 (Exact Date)   SpO2 96%   Physical Exam Vitals and nursing note reviewed.  Constitutional:      General: She is not in acute distress.    Appearance: She is well-developed. She is ill-appearing and toxic-appearing.  HENT:     Head: Atraumatic.  Eyes:     Pupils: Pupils are equal, round, and reactive to light.  Cardiovascular:     Rate and Rhythm: Tachycardia present.     Pulses: Normal pulses.          Radial pulses are 2+ on the right side and 2+ on the left side.       Dorsalis pedis pulses are 2+ on the right side and 2+ on the left side.     Heart sounds: Normal heart sounds.  Pulmonary:     Effort: Pulmonary effort is normal. No  respiratory distress.     Breath sounds: Examination of the right-upper field reveals rhonchi. Examination of the right-middle field reveals rhonchi. Examination of the right-lower field reveals rhonchi. Rhonchi present.  Chest:     Chest wall: Tenderness present. No crepitus.  Abdominal:     General: Bowel sounds are normal. There is no distension.     Palpations: Abdomen is soft. There is no mass.     Tenderness: There is no abdominal tenderness. There is no guarding or rebound.  Musculoskeletal:        General: Normal range of motion.     Cervical back: Normal range of motion.     Right lower leg: No tenderness. No edema.     Left lower leg: No tenderness. No edema.  Skin:    General: Skin is warm and dry.     Capillary Refill: Capillary refill takes less than 2 seconds.  Neurological:     General: No focal deficit present.     Mental Status: She is alert and oriented to person, place, and time.     Cranial Nerves: No cranial nerve deficit.     Motor: No weakness.  Psychiatric:        Mood and Affect: Mood normal.     (all labs ordered are listed, but only abnormal results are displayed) Labs Reviewed  CBC WITH DIFFERENTIAL/PLATELET - Abnormal; Notable for the following components:      Result Value   WBC 16.9 (*)    Neutro Abs 12.6 (*)    Monocytes Absolute 1.8 (*)    Abs Immature Granulocytes 0.16 (*)    All other components within normal limits  URINALYSIS, W/ REFLEX TO CULTURE (INFECTION SUSPECTED) - Abnormal; Notable for the following components:   Hgb urine dipstick SMALL (*)    All other components within normal limits  CULTURE, BLOOD (ROUTINE X 2)  CULTURE, BLOOD (ROUTINE X 2)  RESP PANEL BY RT-PCR (RSV, FLU A&B, COVID)  RVPGX2  COMPREHENSIVE METABOLIC PANEL WITH GFR  HCG, SERUM, QUALITATIVE  PROTIME-INR  I-STAT CG4 LACTIC ACID, ED  TROPONIN T, HIGH SENSITIVITY    EKG: None  Radiology: DG Chest 2 View if patient is not in a treatment room. Result Date:  04/11/2024 CLINICAL DATA:  Suspected sepsis. Recently diagnosed with pneumonia. Ongoing cough with shortness of breath and chest discomfort. Fever. EXAM: CHEST - 2 VIEW COMPARISON:  04/06/2024. FINDINGS: The heart size and mediastinal contours are within normal limits. There is increased consolidation in the right upper lobe with questionable air-fluid levels or cavitation within the consolidation. A few airspace opacities are noted in the lower lung fields bilaterally. No effusion or pneumothorax is seen. No acute osseous abnormality. IMPRESSION: Interval worsening of right  upper lobe consolidation with possible air-fluid levels or cavitation in this region. Additional airspace disease is seen at the lung bases. CT is recommended for further evaluation. Electronically Signed   By: Leita Birmingham M.D.   On: 04/11/2024 15:50     Procedures   Medications Ordered in the ED  lactated ringers  infusion (has no administration in time range)  lactated ringers  bolus 1,000 mL (has no administration in time range)    And  lactated ringers  bolus 1,000 mL (has no administration in time range)    And  lactated ringers  bolus 500 mL (has no administration in time range)  cefTRIAXone  (ROCEPHIN ) 2 g in sodium chloride  0.9 % 100 mL IVPB (has no administration in time range)  azithromycin  (ZITHROMAX ) 500 mg in sodium chloride  0.9 % 250 mL IVPB (has no administration in time range)  acetaminophen  (TYLENOL ) tablet 650 mg (650 mg Oral Given 04/11/24 74)    48 year old history of hypertension here for evaluation of feeling unwell.  Week ago seen at urgent care diagnosed with right sided pneumonia started on Augmentin  and azithromycin .  She continues to have pleuritic chest pain, hemoptysis and persistent fevers up to 103 at home.  No nausea, vomiting or history of aspiration.  She has been compliant with her home antibiotics.  Here patient is febrile, tachycardic, tachypneic.  Code sepsis called.  Will give antibiotics  for pneumonia.  Will add on CTA chest given her hemoptysis  Labs and imaging personally viewed and interpreted:  CBC leukocytosis 16.9 CMP potassium 2.4 will add on magnesium, supplement oral, IV Lactic 0.7 UA negative for infection Chest x-ray worsening pneumonia, possible cavitary lesion recommend CT scan for further evaluation EKG wo ischemic changes Trop <15  CTA with large right sided pneumonia with cavitary and necrosis  Patient reassessed.  No hypoxia.  She does meet sepsis criteria.  Getting Vanco and cefepime .  Getting multiple rounds of IV potassium for severe hypokalemia. Will admit to medicine    Clinical Course as of 04/11/24 2205  [place] Apr 11, 2024  2027 Dr. Charlton with medicine team agreeable to evaluate patient for admission [BH]    Clinical Course User Index [BH] Promise Bushong A, PA-C    The patient appears reasonably stabilized for admission considering the current resources, flow, and capabilities available in the ED at this time, and I doubt any other [facility] requiring further screening and/or treatment in the ED prior to admission.                                 Medical Decision Making Amount and/or Complexity of Data Reviewed External Data Reviewed: labs, radiology, ECG and notes. Labs: ordered. Decision-making details documented in ED Course. Radiology: ordered and independent interpretation performed. Decision-making details documented in ED Course. ECG/medicine tests: ordered and independent interpretation performed. Decision-making details documented in ED Course.  Risk OTC drugs. Prescription drug management. Parenteral controlled substances. Decision regarding hospitalization. Diagnosis or treatment significantly limited by social determinants of health.       Final diagnoses:  Sepsis without acute organ dysfunction, due to unspecified organism [facility])  Cavitary pneumonia  Hyponatremia    ED Discharge Orders     None           Zeynab Klett A, PA-C 04/11/24 2206    Patt Alm Macho, MD 04/11/24 2300

## 2024-04-11 NOTE — ED Triage Notes (Signed)
 Pt arrives via POV. Pt was recently diagnosed with pneumonia. No improved despite antibiotics. She reports ongoing cough, sob, and some discomfort in her ribs/chest from coughing. Fever of 102.7 during triage. Took tylenol  around 0800 this morning. PT AxOx4.

## 2024-04-11 NOTE — H&P (Signed)
 History and Physical    Julie Smith FMW:[id number] DOB: Jul 03, 1975 DOA: 04/11/2024  PCP: Pcp, No   Patient coming from: Home   Chief Complaint: Fever, cough, malaise   HPI: Julie Smith is a 48 y.o. female with medical history significant for hypertension who was diagnosed with pneumonia and started on antibiotics on 04/06/2024 and now presents with ongoing fevers, cough, and malaise.  Patient reports developing cough, fatigue, and shortness of breath roughly 10 days ago.  Cough became productive of thick blood-tinged sputum.  She was seen at an urgent care on 04/06/2024 where she was diagnosed with pneumonia and started on azithromycin , Augmentin , prednisone , and Diflucan .  Patient states that she has 1 dose of the antibiotic remaining but does not feel any better at all, and may even be worsening.  She continues to have fevers.  ED Course: Upon arrival to the ED, patient is found to be febrile to 39.3 C and saturating mid 90s on room air with mild tachypnea, mild tachycardia, and stable blood pressure.  Labs are most notable for potassium 2.4, normal creatinine, WBC 16,900, and normal lactate.  Chest x-ray demonstrates increased right upper lobe consolidation with possible air-fluid level.  CTA chest is negative for PE but concerning for right upper lobe pneumonia with cavitation.  Blood cultures were collected in the ED and the patient was given 2.5 L LR, oral and IV potassium, vancomycin , and cefepime .  Review of Systems:  All other systems reviewed and apart from HPI, are negative.  Past Medical History:  Diagnosis Date   Hypertension     Past Surgical History:  Procedure Laterality Date   ECTOPIC PREGNANCY SURGERY     SKIN BIOPSY      Social History:   reports that she has been smoking cigarettes. She has a 10 pack-year smoking history. She has never used smokeless tobacco. She reports current alcohol use. She reports that she does not use drugs.  Allergies  Allergen  Reactions   Codeine Other (See Comments)    Hallucinations     History reviewed. No pertinent family history.   Prior to Admission medications   Medication Sig Start Date End Date Taking? Authorizing Provider  albuterol  (VENTOLIN  HFA) 108 (90 Base) MCG/ACT inhaler Inhale 1-2 puffs into the lungs every 6 (six) hours as needed for wheezing or shortness of breath. 04/06/24   White, Elizabeth A, PA-C  amoxicillin -clavulanate (AUGMENTIN ) 875-125 MG tablet Take 1 tablet by mouth every 12 (twelve) hours for 5 days. 04/06/24 04/11/24  Teresa Norris A, PA-C  azithromycin  (ZITHROMAX ) 250 MG tablet Take first 2 tablets together, then 1 every day until finished. 04/06/24   White, Elizabeth A, PA-C  benzonatate  (TESSALON ) 100 MG capsule Take 1 capsule (100 mg total) by mouth every 8 (eight) hours. 04/06/24   White, Elizabeth A, PA-C  buPROPion  (WELLBUTRIN  SR) 150 MG 12 hr tablet Take 1 tablet (150 mg total) by mouth 2 (two) times daily. For Smoking cessation Patient not taking: Reported on 04/16/2022 07/12/19   Newlin, Enobong, MD  hydrochlorothiazide  (HYDRODIURIL ) 25 MG tablet Take 1 tablet (25 mg total) by mouth daily. Patient not taking: Reported on 04/16/2022 07/12/19   Newlin, Enobong, MD  hydrOXYzine  (ATARAX /VISTARIL ) 25 MG tablet Take 1 tablet (25 mg total) by mouth 3 (three) times daily as needed. Patient not taking: Reported on 04/16/2022 05/31/19   Newlin, Enobong, MD  ibuprofen  (ADVIL ,MOTRIN ) 200 MG tablet Take 800 mg by mouth every 6 (six) hours as needed.  [provider]  ondansetron  (ZOFRAN -ODT) 4 MG disintegrating tablet Take 1 tablet (4 mg total) by mouth every 8 (eight) hours as needed for nausea or vomiting. 04/06/24   White, Elizabeth A, PA-C  potassium chloride  (KLOR-CON  10) 10 MEQ tablet Take 1 tablet (10 mEq total) by mouth daily. Patient not taking: Reported on 04/16/2022 07/12/19   Delbert Clam, MD    Physical Exam: Vitals:   04/11/24 1508 04/11/24 1518 04/11/24  1930 04/11/24 2004  BP: (!) 147/98  (!) 147/97   Pulse: 100  83   Resp: 19 (!) 22 20   Temp: (!) 102.7 F (39.3 C)   100.3 F (37.9 C)  TempSrc: Oral     SpO2: 96%  95%     Constitutional: NAD, no pallor or diaphoresis   Eyes: PERTLA, lids and conjunctivae normal ENMT: Mucous membranes are moist. Posterior pharynx clear of any exudate or lesions.   Neck: supple, no masses  Respiratory: no wheezing, no crackles. No accessory muscle use.  Cardiovascular: S1 & S2 heard, regular rate and rhythm. No extremity edema.  Abdomen: No tenderness, soft. Bowel sounds active.  Musculoskeletal: no clubbing / cyanosis. No joint deformity upper and lower extremities.   Skin: no significant rashes, lesions, ulcers. Warm, dry, well-perfused. Neurologic: CN 2-12 grossly intact. Moving all extremities. Alert and oriented.  Psychiatric: Calm. Cooperative.    Labs and Imaging on Admission: I have personally reviewed following labs and imaging studies  CBC: Recent Labs  Lab 04/11/24 1530  WBC 16.9*  NEUTROABS 12.6*  HGB 13.0  HCT 37.8  MCV 92.9  PLT 331   Basic Metabolic Panel: Recent Labs  Lab 04/11/24 1530  NA 137  K 2.4*  CL 95*  CO2 29  GLUCOSE 110*  BUN 6  CREATININE 0.67  CALCIUM 9.1   GFR: Estimated Creatinine Clearance: 94 mL/min (by C-G formula based on SCr of 0.67 mg/dL). Liver Function Tests: Recent Labs  Lab 04/11/24 1530  AST 18  ALT 36  ALKPHOS 103  BILITOT 1.0  PROT 7.4  ALBUMIN 3.5   No results for input(s): LIPASE, AMYLASE in the last 168 hours. No results for input(s): AMMONIA in the last 168 hours. Coagulation Profile: Recent Labs  Lab 04/11/24 1716  INR 1.3*   Cardiac Enzymes: No results for input(s): CKTOTAL, CKMB, CKMBINDEX, TROPONINI in the last 168 hours. BNP (last 3 results) No results for input(s): PROBNP in the last 8760 hours. HbA1C: No results for input(s): HGBA1C in the last 72 hours. CBG: No results for input(s):  GLUCAP in the last 168 hours. Lipid Profile: No results for input(s): CHOL, HDL, LDLCALC, TRIG, CHOLHDL, LDLDIRECT in the last 72 hours. Thyroid Function Tests: No results for input(s): TSH, T4TOTAL, FREET4, T3FREE, THYROIDAB in the last 72 hours. Anemia Panel: No results for input(s): VITAMINB12, FOLATE, FERRITIN, TIBC, IRON, RETICCTPCT in the last 72 hours. Urine analysis:    Component Value Date/Time   COLORURINE YELLOW 04/11/2024 1600   APPEARANCEUR CLEAR 04/11/2024 1600   LABSPEC 1.009 04/11/2024 1600   PHURINE 6.0 04/11/2024 1600   GLUCOSEU NEGATIVE 04/11/2024 1600   HGBUR SMALL (A) 04/11/2024 1600   BILIRUBINUR NEGATIVE 04/11/2024 1600   KETONESUR NEGATIVE 04/11/2024 1600   PROTEINUR NEGATIVE 04/11/2024 1600   UROBILINOGEN 0.2 09/15/2014 2007   NITRITE NEGATIVE 04/11/2024 1600   LEUKOCYTESUR NEGATIVE 04/11/2024 1600   Sepsis Labs: @LABRCNTIP (procalcitonin:4,lacticidven:4) ) Recent Results (from the past 240 hours)  Culture, blood (Routine x 2)     Status: None (  Preliminary result)   Collection Time: 04/11/24  3:30 PM   Specimen: BLOOD LEFT ARM  Result Value Ref Range Status   Specimen Description   Final    BLOOD LEFT ARM Performed at [facility], [address], [place], [place] [address]    Special Requests   Final    BOTTLES DRAWN AEROBIC AND ANAEROBIC Blood Culture adequate volume Performed at [facility], [address], [place], [place] [address]    Culture PENDING  Incomplete   Report Status PENDING  Incomplete  Culture, blood (Routine x 2)     Status: None (Preliminary result)   Collection Time: 04/11/24  3:37 PM   Specimen: BLOOD RIGHT ARM  Result Value Ref Range Status   Specimen Description   Final    BLOOD RIGHT ARM Performed at [facility], [address], [place], [place] [address]    Special Requests   Final    BOTTLES DRAWN AEROBIC AND ANAEROBIC Blood Culture results may not  be optimal due to an inadequate volume of blood received in culture bottles Performed at [facility], [address] [place], [place] [address]    Culture PENDING  Incomplete   Report Status PENDING  Incomplete  Resp panel by RT-PCR (RSV, Flu A&B, Covid) Anterior Nasal Swab     Status: None   Collection Time: 04/11/24  3:47 PM   Specimen: Anterior Nasal Swab  Result Value Ref Range Status   SARS Coronavirus 2 by RT PCR NEGATIVE NEGATIVE Final    Comment: (NOTE) SARS-CoV-2 target nucleic acids are NOT DETECTED.  The SARS-CoV-2 RNA is generally detectable in upper respiratory specimens during the acute phase of infection. The lowest concentration of SARS-CoV-2 viral copies this assay can detect is 138 copies/mL. A negative result does not preclude SARS-Cov-2 infection and should not be used as the sole basis for treatment or other patient management decisions. A negative result may occur with  improper specimen collection/handling, submission of specimen other than nasopharyngeal swab, presence of viral mutation(s) within the areas targeted by this assay, and inadequate number of viral copies(<138 copies/mL). A negative result must be combined with clinical observations, patient history, and epidemiological information. The expected result is Negative.  Fact Sheet for Patients:  bloggercourse.com  Fact Sheet for Healthcare Providers:  seriousbroker.it  This test is no t yet approved or cleared by the [place]  FDA and  has been authorized for detection and/or diagnosis of SARS-CoV-2 by FDA under an Emergency Use Authorization (EUA). This EUA will remain  in effect (meaning this test can be used) for the duration of the COVID-19 declaration under Section 564(b)(1) of the Act, 21 U.S.C.section 360bbb-3(b)(1), unless the authorization is terminated  or revoked sooner.       Influenza A by PCR NEGATIVE  NEGATIVE Final   Influenza B by PCR NEGATIVE NEGATIVE Final    Comment: (NOTE) The Xpert Xpress SARS-CoV-2/FLU/RSV plus assay is intended as an aid in the diagnosis of influenza from Nasopharyngeal swab specimens and should not be used as a sole basis for treatment. Nasal washings and aspirates are unacceptable for Xpert Xpress SARS-CoV-2/FLU/RSV testing.  Fact Sheet for Patients: bloggercourse.com  Fact Sheet for Healthcare Providers: seriousbroker.it  This test is not yet approved or cleared by the [place]  FDA and has been authorized for detection and/or diagnosis of SARS-CoV-2 by FDA under an Emergency Use Authorization (EUA). This EUA will remain in effect (meaning this test can be  used) for the duration of the COVID-19 declaration under Section 564(b)(1) of the Act, 21 U.S.C. section 360bbb-3(b)(1), unless the authorization is terminated or revoked.     Resp Syncytial Virus by PCR NEGATIVE NEGATIVE Final    Comment: (NOTE) Fact Sheet for Patients: bloggercourse.com  Fact Sheet for Healthcare Providers: seriousbroker.it  This test is not yet approved or cleared by the [place]  FDA and has been authorized for detection and/or diagnosis of SARS-CoV-2 by FDA under an Emergency Use Authorization (EUA). This EUA will remain in effect (meaning this test can be used) for the duration of the COVID-19 declaration under Section 564(b)(1) of the Act, 21 U.S.C. section 360bbb-3(b)(1), unless the authorization is terminated or revoked.  Performed at [facility], [address] [place], [place] [address]      Radiological Exams on Admission: CT Angio Chest PE W and/or Wo Contrast Result Date: 04/11/2024 EXAM: CTA CHEST AORTA 04/11/2024 07:54:02 PM TECHNIQUE: CTA of the chest was performed without and with the administration of 75 mL of iohexol   (OMNIPAQUE ) 350 MG/ML injection. Multiplanar reformatted images are provided for review. MIP images are provided for review. Automated exposure control, iterative reconstruction, and/or weight based adjustment of the mA/kV was utilized to reduce the radiation dose to as low as reasonably achievable. COMPARISON: None available. CLINICAL HISTORY: Pulmonary embolism (PE) suspected, high prob. FINDINGS: AORTA: No thoracic aortic dissection. No aneurysm. MEDIASTINUM: No mediastinal lymphadenopathy. The heart and pericardium demonstrate no acute abnormality. LYMPH NODES: No mediastinal, hilar or axillary lymphadenopathy. LUNGS AND PLEURA: Dense consolidation in the right upper lobe with areas of cystic change/cavitation compatible with pneumonia with cavitation/necrosis. Plate-like atelectasis in the lingula. No pleural effusion or pneumothorax. UPPER ABDOMEN: Limited images of the upper abdomen are unremarkable. SOFT TISSUES AND BONES: No acute bone or soft tissue abnormality. IMPRESSION: 1. No evidence of pulmonary embolism. 2. Dense right upper lobe pneumonia with cavitation/necrosis. 3. Plate-like atelectasis in the lingula. Electronically signed by: Franky Crease MD 04/11/2024 07:59 PM EST RP Workstation: [id number]   DG Chest 2 View if patient is not in a treatment room. Result Date: 04/11/2024 CLINICAL DATA:  Suspected sepsis. Recently diagnosed with pneumonia. Ongoing cough with shortness of breath and chest discomfort. Fever. EXAM: CHEST - 2 VIEW COMPARISON:  04/06/2024. FINDINGS: The heart size and mediastinal contours are within normal limits. There is increased consolidation in the right upper lobe with questionable air-fluid levels or cavitation within the consolidation. A few airspace opacities are noted in the lower lung fields bilaterally. No effusion or pneumothorax is seen. No acute osseous abnormality. IMPRESSION: Interval worsening of right upper lobe consolidation with possible air-fluid levels or  cavitation in this region. Additional airspace disease is seen at the lung bases. CT is recommended for further evaluation. Electronically Signed   By: Leita Birmingham M.D.   On: 04/11/2024 15:50    EKG: Independently reviewed. Sinus rhythm.   Assessment/Plan   1. Cavitary pneumonia - Culture sputum, check strep pneumo and legionella urinary antigens, fungitell, and AFB smears/culture, treat with Rocephin , doxycycline , and vancomycin , trend procalcitonin, follow cultures and clinical course    2. Hypokalemia  - Replaced in ED, will repeat chem panel in am    3. Hypertension  - Not currently on any antihypertensives at home, will treat as-needed     DVT prophylaxis: Lovenox   Code Status: Full  Level of Care: Level of care: Telemetry Family Communication: Significant other at bedside   Disposition Plan:  Patient is from: Home  Anticipated d/c is to: Home  Anticipated d/c date is: 04/14/24 Patient currently: Pending treatment of pneumonia  Consults called: None  Admission status: Inpatient     Evalene GORMAN Sprinkles, MD [facility]  04/11/2024, 8:31 PM

## 2024-04-11 NOTE — Sepsis Progress Note (Signed)
 Elink will follow per sepsis protocol.

## 2024-04-12 DIAGNOSIS — J15211 Pneumonia due to Methicillin susceptible Staphylococcus aureus: Secondary | ICD-10-CM

## 2024-04-12 LAB — MAGNESIUM: Magnesium: 2.1 mg/dL (ref 1.7–2.4)

## 2024-04-12 LAB — CBC
HCT: 34.1 % — ABNORMAL LOW (ref 36.0–46.0)
Hemoglobin: 11.6 g/dL — ABNORMAL LOW (ref 12.0–15.0)
MCH: 32.4 pg (ref 26.0–34.0)
MCHC: 34 g/dL (ref 30.0–36.0)
MCV: 95.3 fL (ref 80.0–100.0)
Platelets: 306 K/uL (ref 150–400)
RBC: 3.58 MIL/uL — ABNORMAL LOW (ref 3.87–5.11)
RDW: 11.9 % (ref 11.5–15.5)
WBC: 19.4 K/uL — ABNORMAL HIGH (ref 4.0–10.5)
nRBC: 0 % (ref 0.0–0.2)

## 2024-04-12 LAB — MRSA NEXT GEN BY PCR, NASAL: MRSA by PCR Next Gen: DETECTED — AB

## 2024-04-12 LAB — BASIC METABOLIC PANEL WITH GFR
Anion gap: 11 (ref 5–15)
BUN: 5 mg/dL — ABNORMAL LOW (ref 6–20)
CO2: 26 mmol/L (ref 22–32)
Calcium: 8.3 mg/dL — ABNORMAL LOW (ref 8.9–10.3)
Chloride: 101 mmol/L (ref 98–111)
Creatinine, Ser: 0.6 mg/dL (ref 0.44–1.00)
GFR, Estimated: >60 mL/min (ref >60)
Glucose, Bld: 93 mg/dL (ref 70–99)
Potassium: 2.9 mmol/L — ABNORMAL LOW (ref 3.5–5.1)
Sodium: 138 mmol/L (ref 135–145)

## 2024-04-12 LAB — EXPECTORATED SPUTUM ASSESSMENT W GRAM STAIN, RFLX TO RESP C

## 2024-04-12 LAB — PROCALCITONIN: Procalcitonin: 0.15 ng/mL

## 2024-04-12 LAB — STREP PNEUMONIAE URINARY ANTIGEN: Strep Pneumo Urinary Antigen: NEGATIVE

## 2024-04-12 LAB — HIV ANTIBODY (ROUTINE TESTING W REFLEX): HIV Screen 4th Generation wRfx: NONREACTIVE

## 2024-04-12 MED ORDER — METRONIDAZOLE 500 MG/100ML IV SOLN
500.0000 mg | Freq: Two times a day (BID) | INTRAVENOUS | Status: DC
  Administered 2024-04-12 – 2024-04-14 (×6): 500 mg via INTRAVENOUS
  Filled 2024-04-12 (×6): qty 100

## 2024-04-12 MED ORDER — PANTOPRAZOLE SODIUM 40 MG PO TBEC
40.0000 mg | DELAYED_RELEASE_TABLET | Freq: Every day | ORAL | Status: DC
  Administered 2024-04-12 – 2024-04-15 (×4): 40 mg via ORAL
  Filled 2024-04-12 (×4): qty 1

## 2024-04-12 MED ORDER — CHLORHEXIDINE GLUCONATE CLOTH 2 % EX PADS
6.0000 | MEDICATED_PAD | Freq: Every day | CUTANEOUS | Status: DC
  Administered 2024-04-12 – 2024-04-15 (×4): 6 via TOPICAL

## 2024-04-12 MED ORDER — MUPIROCIN 2 % EX OINT
1.0000 | TOPICAL_OINTMENT | Freq: Two times a day (BID) | CUTANEOUS | Status: DC
  Administered 2024-04-12 – 2024-04-15 (×8): 1 via NASAL
  Filled 2024-04-12 (×3): qty 22

## 2024-04-12 MED ORDER — ACETAMINOPHEN 325 MG PO TABS
650.0000 mg | ORAL_TABLET | ORAL | Status: DC | PRN
  Administered 2024-04-12 – 2024-04-14 (×4): 650 mg via ORAL
  Filled 2024-04-12 (×4): qty 2

## 2024-04-12 MED ORDER — VITAMIN D 25 MCG (1000 UNIT) PO TABS
1000.0000 [IU] | ORAL_TABLET | Freq: Every day | ORAL | Status: DC
  Administered 2024-04-12 – 2024-04-15 (×4): 1000 [IU] via ORAL
  Filled 2024-04-12 (×4): qty 1

## 2024-04-12 MED ORDER — ACETAMINOPHEN 650 MG RE SUPP: 650.0000 mg | Freq: Four times a day (QID) | RECTAL | Status: DC | PRN

## 2024-04-12 MED ORDER — VANCOMYCIN HCL IN DEXTROSE 1-5 GM/200ML-% IV SOLN
1000.0000 mg | Freq: Two times a day (BID) | INTRAVENOUS | Status: DC
  Administered 2024-04-12 – 2024-04-15 (×6): 1000 mg via INTRAVENOUS
  Filled 2024-04-12 (×6): qty 200

## 2024-04-12 MED ORDER — POTASSIUM CHLORIDE CRYS ER 20 MEQ PO TBCR
40.0000 meq | EXTENDED_RELEASE_TABLET | Freq: Three times a day (TID) | ORAL | Status: DC
  Administered 2024-04-12 (×3): 40 meq via ORAL
  Filled 2024-04-12 (×3): qty 2

## 2024-04-12 NOTE — Progress Notes (Signed)
 PROGRESS NOTE    Julie CECILIO  FMW:[id number] DOB: 09/22/1975 DOA: 04/11/2024 PCP: Pcp, No    Brief Narrative:  48 year old with history of hypertension, started having some shortness of breath and cough for about a week, her granddaughter had URI symptoms and she had taken care of her.  No respiratory issues prior to 1 week.  Seen at urgent care on 11/18, diagnosed with pneumonia and started on azithromycin , Augmentin , prednisone  and Diflucan .  In the emergency room temperature 39.3, on room air, mildly tachycardic.  Blood pressure stable.  Potassium 2.4.  WBC 16.9 and normal lactate.  Chest x-ray and CT scan concerning with right upper and middle lobe cavitary pneumonia.  Admitted with IV antibiotics.  Subjective: Patient seen and examined.  She has pleuritic chest pain on the right side.  Has some mucoid sputum production, sent to the lab.  WBC count 19.4.  Able to take oral potassium.  On room air.  Patient does not have any history of tuberculosis or exposure, no risk factors.  No recent travel.  Acute onset of pneumonia with leukocytosis.  Will discontinue airborne precautions and AFB sputum's.   Assessment & Plan:   Right upper lobe cavitary pneumonia: Possible pneumonia due to MRSA. Antibiotics to treat bacterial pneumonia.  On multiple antibiotics.  Will continue vancomycin , Flagyl  and Rocephin  today for broad-spectrum coverage. Chest physiotherapy, incentive spirometry, deep breathing exercises, sputum induction, mucolytic's and bronchodilators. Sputum cultures, blood cultures, Legionella and streptococcal antigen. MRSA swab positive. Supplemental oxygen to keep saturations more than 90%. Respiratory virus panel negative.  Hypokalemia: Persistent.  Also on hydrochlorothiazide  at home.  Replace aggressively and monitor levels.  Magnesium is adequate.  Hypertension: Blood pressure is stable.  Holding antihypertensives.      DVT prophylaxis: enoxaparin  (LOVENOX ) injection 40 mg  Start: 04/11/24 2200   Code Status: Full code Family Communication: Father at bedside Disposition Plan: Status is: Inpatient Remains inpatient appropriate because: Significant infection, IV antibiotics     Consultants:  None  Procedures:  None  Antimicrobials:  Rocephin  11/23-- Vancomycin  11/23--- Doxycycline  11/23--11/24 Flagyl  11/24---     Objective: Vitals:   04/12/24 0212 04/12/24 0452 04/12/24 0826 04/12/24 1259  BP: (!) 164/98 (!) 145/91 (!) 145/76 (!) 148/89  Pulse: 89 77 92 (!) 103  Resp: 18 17 20 17   Temp: (!) 101.5 F (38.6 C) 99.2 F (37.3 C) 99.4 F (37.4 C) 99.2 F (37.3 C)  TempSrc:      SpO2: 97% 99% 97% 95%    Intake/Output Summary (Last 24 hours) at 04/12/2024 1300 Last data filed at 04/11/2024 2042 Gross per 24 hour  Intake 2777.5 ml  Output --  Net 2777.5 ml   There were no vitals filed for this visit.  Examination:  General exam: Appears calm and comfortable.  Mildly anxious. Respiratory system: Clear to auscultation. Respiratory effort normal.  Poor air entry at bases however no added sounds. Cardiovascular system: S1 & S2 heard, RRR. No JVD, murmurs, rubs, gallops or clicks. No pedal edema. Gastrointestinal system: Abdomen is nondistended, soft and nontender. No organomegaly or masses felt. Normal bowel sounds heard. Central nervous system: Alert and oriented. No focal neurological deficits. Extremities: Symmetric 5 x 5 power. Skin: No rashes, lesions or ulcers Psychiatry: Judgement and insight appear normal. Mood & affect appropriate.     Data Reviewed: I have personally reviewed following labs and imaging studies  CBC: Recent Labs  Lab 04/11/24 1530 04/12/24 0546  WBC 16.9* 19.4*  NEUTROABS 12.6*  --  HGB 13.0 11.6*  HCT 37.8 34.1*  MCV 92.9 95.3  PLT 331 306   Basic Metabolic Panel: Recent Labs  Lab 04/11/24 1530 04/11/24 1641 04/12/24 0546  NA 137  --  138  K 2.4*  --  2.9*  CL 95*  --  101  CO2 29  --  26   GLUCOSE 110*  --  93  BUN 6  --  5*  CREATININE 0.67  --  0.60  CALCIUM 9.1  --  8.3*  MG  --  2.1 2.1   GFR: Estimated Creatinine Clearance: 94 mL/min (by C-G formula based on SCr of 0.6 mg/dL). Liver Function Tests: Recent Labs  Lab 04/11/24 1530  AST 18  ALT 36  ALKPHOS 103  BILITOT 1.0  PROT 7.4  ALBUMIN 3.5   No results for input(s): LIPASE, AMYLASE in the last 168 hours. No results for input(s): AMMONIA in the last 168 hours. Coagulation Profile: Recent Labs  Lab 04/11/24 1716  INR 1.3*   Cardiac Enzymes: No results for input(s): CKTOTAL, CKMB, CKMBINDEX, TROPONINI in the last 168 hours. BNP (last 3 results) No results for input(s): PROBNP in the last 8760 hours. HbA1C: No results for input(s): HGBA1C in the last 72 hours. CBG: No results for input(s): GLUCAP in the last 168 hours. Lipid Profile: No results for input(s): CHOL, HDL, LDLCALC, TRIG, CHOLHDL, LDLDIRECT in the last 72 hours. Thyroid Function Tests: No results for input(s): TSH, T4TOTAL, FREET4, T3FREE, THYROIDAB in the last 72 hours. Anemia Panel: No results for input(s): VITAMINB12, FOLATE, FERRITIN, TIBC, IRON, RETICCTPCT in the last 72 hours. Sepsis Labs: Recent Labs  Lab 04/11/24 1535 04/11/24 2231 04/12/24 0546  PROCALCITON  --  0.14 0.15  LATICACIDVEN 0.7  --   --     Recent Results (from the past 240 hours)  Culture, blood (Routine x 2)     Status: None (Preliminary result)   Collection Time: 04/11/24  3:30 PM   Specimen: BLOOD LEFT ARM  Result Value Ref Range Status   Specimen Description   Final    BLOOD LEFT ARM Performed at [facility], [address], [place], [place] [address]    Special Requests   Final    BOTTLES DRAWN AEROBIC AND ANAEROBIC Blood Culture adequate volume Performed at [facility], [address], [place], [place] [address]    Culture   Final    NO GROWTH < 12  HOURS Performed at [facility], [address] [place], [place] [address]    Report Status PENDING  Incomplete  Culture, blood (Routine x 2)     Status: None (Preliminary result)   Collection Time: 04/11/24  3:37 PM   Specimen: BLOOD RIGHT ARM  Result Value Ref Range Status   Specimen Description   Final    BLOOD RIGHT ARM Performed at [facility], [address], [place], [place] [address]    Special Requests   Final    BOTTLES DRAWN AEROBIC AND ANAEROBIC Blood Culture results may not be optimal due to an inadequate volume of blood received in culture bottles Performed at [facility], [address], [place], [place] [address]    Culture   Final    NO GROWTH < 12 HOURS Performed at [facility], [address], [place], [place] [address]    Report Status PENDING  Incomplete  Resp panel by RT-PCR (RSV, Flu A&B, Covid) Anterior Nasal Swab  Status: None   Collection Time: 04/11/24  3:47 PM   Specimen: Anterior Nasal Swab  Result Value Ref Range Status   SARS Coronavirus 2 by RT PCR NEGATIVE NEGATIVE Final    Comment: (NOTE) SARS-CoV-2 target nucleic acids are NOT DETECTED.  The SARS-CoV-2 RNA is generally detectable in upper respiratory specimens during the acute phase of infection. The lowest concentration of SARS-CoV-2 viral copies this assay can detect is 138 copies/mL. A negative result does not preclude SARS-Cov-2 infection and should not be used as the sole basis for treatment or other patient management decisions. A negative result may occur with  improper specimen collection/handling, submission of specimen other than nasopharyngeal swab, presence of viral mutation(s) within the areas targeted by this assay, and inadequate number of viral copies(<138 copies/mL). A negative result must be combined with clinical observations, patient history, and epidemiological information. The expected result is Negative.  Fact Sheet for  Patients:  bloggercourse.com  Fact Sheet for Healthcare Providers:  seriousbroker.it  This test is no t yet approved or cleared by the [place]  FDA and  has been authorized for detection and/or diagnosis of SARS-CoV-2 by FDA under an Emergency Use Authorization (EUA). This EUA will remain  in effect (meaning this test can be used) for the duration of the COVID-19 declaration under Section 564(b)(1) of the Act, 21 U.S.C.section 360bbb-3(b)(1), unless the authorization is terminated  or revoked sooner.       Influenza A by PCR NEGATIVE NEGATIVE Final   Influenza B by PCR NEGATIVE NEGATIVE Final    Comment: (NOTE) The Xpert Xpress SARS-CoV-2/FLU/RSV plus assay is intended as an aid in the diagnosis of influenza from Nasopharyngeal swab specimens and should not be used as a sole basis for treatment. Nasal washings and aspirates are unacceptable for Xpert Xpress SARS-CoV-2/FLU/RSV testing.  Fact Sheet for Patients: bloggercourse.com  Fact Sheet for Healthcare Providers: seriousbroker.it  This test is not yet approved or cleared by the [place]  FDA and has been authorized for detection and/or diagnosis of SARS-CoV-2 by FDA under an Emergency Use Authorization (EUA). This EUA will remain in effect (meaning this test can be used) for the duration of the COVID-19 declaration under Section 564(b)(1) of the Act, 21 U.S.C. section 360bbb-3(b)(1), unless the authorization is terminated or revoked.     Resp Syncytial Virus by PCR NEGATIVE NEGATIVE Final    Comment: (NOTE) Fact Sheet for Patients: bloggercourse.com  Fact Sheet for Healthcare Providers: seriousbroker.it  This test is not yet approved or cleared by the [place]  FDA and has been authorized for detection and/or diagnosis of SARS-CoV-2 by FDA under an Emergency  Use Authorization (EUA). This EUA will remain in effect (meaning this test can be used) for the duration of the COVID-19 declaration under Section 564(b)(1) of the Act, 21 U.S.C. section 360bbb-3(b)(1), unless the authorization is terminated or revoked.  Performed at [facility], [address] [place], [place] [address]   MRSA Next Gen by PCR, Nasal     Status: Abnormal   Collection Time: 04/11/24 10:34 PM   Specimen: Nasal Mucosa; Nasal Swab  Result Value Ref Range Status   MRSA by PCR Next Gen DETECTED (A) NOT DETECTED Final    Comment: CRITICAL RESULT CALLED TO, READ BACK BY AND VERIFIED WITH: SHEN, P. EN AT 0222 04/12/2024 BY JEREMY C (NOTE) The GeneXpert MRSA Assay (FDA approved for NASAL specimens only), is one component of a comprehensive MRSA colonization surveillance program. It is not intended to diagnose  MRSA infection nor to guide or monitor treatment for MRSA infections. Test performance is not FDA approved in patients less than 103 years old. Performed at [facility], [address] [place], [place] [address]   Expectorated Sputum Assessment w Gram Stain, Rflx to Resp Cult     Status: None   Collection Time: 04/11/24 11:26 PM   Specimen: Expectorated Sputum  Result Value Ref Range Status   Specimen Description EXPECTORATED SPUTUM  Final   Special Requests NONE  Final   Sputum evaluation   Final    THIS SPECIMEN IS ACCEPTABLE FOR SPUTUM CULTURE Performed at [facility], [address], [place], [place] [address]    Report Status 04/12/2024 FINAL  Final         Radiology Studies: CT Angio Chest PE W and/or Wo Contrast Result Date: 04/11/2024 EXAM: CTA CHEST AORTA 04/11/2024 07:54:02 PM TECHNIQUE: CTA of the chest was performed without and with the administration of 75 mL of iohexol  (OMNIPAQUE ) 350 MG/ML injection. Multiplanar reformatted images are provided for review. MIP images are provided for  review. Automated exposure control, iterative reconstruction, and/or weight based adjustment of the mA/kV was utilized to reduce the radiation dose to as low as reasonably achievable. COMPARISON: None available. CLINICAL HISTORY: Pulmonary embolism (PE) suspected, high prob. FINDINGS: AORTA: No thoracic aortic dissection. No aneurysm. MEDIASTINUM: No mediastinal lymphadenopathy. The heart and pericardium demonstrate no acute abnormality. LYMPH NODES: No mediastinal, hilar or axillary lymphadenopathy. LUNGS AND PLEURA: Dense consolidation in the right upper lobe with areas of cystic change/cavitation compatible with pneumonia with cavitation/necrosis. Plate-like atelectasis in the lingula. No pleural effusion or pneumothorax. UPPER ABDOMEN: Limited images of the upper abdomen are unremarkable. SOFT TISSUES AND BONES: No acute bone or soft tissue abnormality. IMPRESSION: 1. No evidence of pulmonary embolism. 2. Dense right upper lobe pneumonia with cavitation/necrosis. 3. Plate-like atelectasis in the lingula. Electronically signed by: Franky Crease MD 04/11/2024 07:59 PM EST RP Workstation: [id number]   DG Chest 2 View if patient is not in a treatment room. Result Date: 04/11/2024 CLINICAL DATA:  Suspected sepsis. Recently diagnosed with pneumonia. Ongoing cough with shortness of breath and chest discomfort. Fever. EXAM: CHEST - 2 VIEW COMPARISON:  04/06/2024. FINDINGS: The heart size and mediastinal contours are within normal limits. There is increased consolidation in the right upper lobe with questionable air-fluid levels or cavitation within the consolidation. A few airspace opacities are noted in the lower lung fields bilaterally. No effusion or pneumothorax is seen. No acute osseous abnormality. IMPRESSION: Interval worsening of right upper lobe consolidation with possible air-fluid levels or cavitation in this region. Additional airspace disease is seen at the lung bases. CT is recommended for further  evaluation. Electronically Signed   By: Leita Birmingham M.D.   On: 04/11/2024 15:50        Scheduled Meds:  Chlorhexidine  Gluconate Cloth  6 each Topical Daily   cholecalciferol   1,000 Units Oral Daily   enoxaparin  (LOVENOX ) injection  40 mg Subcutaneous Q24H   mupirocin  ointment  1 Application Nasal BID   pantoprazole   40 mg Oral Daily   potassium chloride   40 mEq Oral TID   sodium chloride  flush  3 mL Intravenous Q12H   Continuous Infusions:  cefTRIAXone  (ROCEPHIN )  IV 2 g (04/11/24 2207)   metronidazole  500 mg (04/12/24 0927)   vancomycin        LOS: 1 day      Renato Applebaum, MD [facility]

## 2024-04-12 NOTE — Plan of Care (Signed)
  Problem: Activity: Goal: Ability to tolerate increased activity will improve Outcome: Progressing   Problem: Respiratory: Goal: Ability to maintain adequate ventilation will improve Outcome: Progressing   Problem: Education: Goal: Knowledge of General Education information will improve Description: Including pain rating scale, medication(s)/side effects and non-pharmacologic comfort measures Outcome: Progressing   Problem: Health Behavior/Discharge Planning: Goal: Ability to manage health-related needs will improve Outcome: Progressing   Problem: Clinical Measurements: Goal: Will remain free from infection Outcome: Progressing Goal: Diagnostic test results will improve Outcome: Progressing Goal: Respiratory complications will improve Outcome: Progressing   Problem: Pain Managment: Goal: General experience of comfort will improve and/or be controlled Outcome: Progressing

## 2024-04-13 ENCOUNTER — Inpatient Hospital Stay (HOSPITAL_COMMUNITY): Payer: MEDICAID

## 2024-04-13 LAB — CBC
HCT: 32.5 % — ABNORMAL LOW (ref 36.0–46.0)
Hemoglobin: 10.7 g/dL — ABNORMAL LOW (ref 12.0–15.0)
MCH: 31.9 pg (ref 26.0–34.0)
MCHC: 32.9 g/dL (ref 30.0–36.0)
MCV: 97 fL (ref 80.0–100.0)
Platelets: 319 K/uL (ref 150–400)
RBC: 3.35 MIL/uL — ABNORMAL LOW (ref 3.87–5.11)
RDW: 12.2 % (ref 11.5–15.5)
WBC: 21.4 K/uL — ABNORMAL HIGH (ref 4.0–10.5)
nRBC: 0 % (ref 0.0–0.2)

## 2024-04-13 LAB — LEGIONELLA PNEUMOPHILA SEROGP 1 UR AG: L. pneumophila Serogp 1 Ur Ag: NEGATIVE

## 2024-04-13 LAB — BASIC METABOLIC PANEL WITH GFR
Anion gap: 11 (ref 5–15)
BUN: 5 mg/dL — ABNORMAL LOW (ref 6–20)
CO2: 24 mmol/L (ref 22–32)
Calcium: 8.4 mg/dL — ABNORMAL LOW (ref 8.9–10.3)
Chloride: 102 mmol/L (ref 98–111)
Creatinine, Ser: 0.61 mg/dL (ref 0.44–1.00)
GFR, Estimated: >60 mL/min (ref >60)
Glucose, Bld: 112 mg/dL — ABNORMAL HIGH (ref 70–99)
Potassium: 3.1 mmol/L — ABNORMAL LOW (ref 3.5–5.1)
Sodium: 137 mmol/L (ref 135–145)

## 2024-04-13 LAB — PROCALCITONIN: Procalcitonin: 0.19 ng/mL

## 2024-04-13 MED ORDER — POTASSIUM CHLORIDE CRYS ER 20 MEQ PO TBCR
40.0000 meq | EXTENDED_RELEASE_TABLET | Freq: Two times a day (BID) | ORAL | Status: DC
  Administered 2024-04-13 – 2024-04-15 (×5): 40 meq via ORAL
  Filled 2024-04-13 (×5): qty 2

## 2024-04-13 NOTE — Progress Notes (Signed)
   04/13/24 1607  TOC Brief Assessment  Insurance and Status Reviewed  Patient has primary care physician No  Home environment has been reviewed single family home  Prior level of function: independent  Prior/Current Home Services No current home services  Social Drivers of Health Review SDOH reviewed no interventions necessary  Readmission risk has been reviewed Yes  Transition of care needs no transition of care needs at this time    Pt does not have PCP listed in her chart. Per chart review, pt is Medicaid pending. No further TOC needs at this time.  Signed: Heather Saltness, MSW, LCSW Clinical Social Worker Inpatient Care Management 04/13/2024 4:08 PM

## 2024-04-13 NOTE — Plan of Care (Signed)
  Problem: Activity: Goal: Ability to tolerate increased activity will improve Outcome: Progressing   Problem: Clinical Measurements: Goal: Ability to maintain a body temperature in the normal range will improve Outcome: Progressing   Problem: Respiratory: Goal: Ability to maintain a clear airway will improve Outcome: Progressing   Problem: Health Behavior/Discharge Planning: Goal: Ability to manage health-related needs will improve Outcome: Progressing   Problem: Clinical Measurements: Goal: Ability to maintain clinical measurements within normal limits will improve Outcome: Progressing Goal: Will remain free from infection Outcome: Progressing Goal: Diagnostic test results will improve Outcome: Progressing   Problem: Pain Managment: Goal: General experience of comfort will improve and/or be controlled Outcome: Progressing   Problem: Safety: Goal: Ability to remain free from injury will improve Outcome: Progressing

## 2024-04-13 NOTE — Plan of Care (Signed)

## 2024-04-13 NOTE — Progress Notes (Signed)
 PROGRESS NOTE    Julie Smith  FMW:[id number] DOB: 26-Sep-1975 DOA: 04/11/2024 PCP: Pcp, No    Brief Narrative:  48 year old with history of hypertension, started having some shortness of breath and cough for about a week, her granddaughter had URI symptoms and she had taken care of her.  No respiratory issues prior to 1 week.  Seen at urgent care on 11/18, diagnosed with pneumonia and started on azithromycin , Augmentin , prednisone  and Diflucan .  In the emergency room temperature 39.3, on room air, mildly tachycardic.  Blood pressure stable.  Potassium 2.4.  WBC 16.9 and normal lactate.  Chest x-ray and CT scan concerning with right upper and middle lobe cavitary pneumonia.  Admitted with IV antibiotics.  Subjective: Patient seen and examined.  Temperature 102 overnight.  Has significant pleuritic pain on the right side and difficult to take deep breaths.  Mostly dry cough. Blood cultures negative. Sputum cultures growing Staph aureus. Chest x-ray done and reviewed, shows consolidation right upper lobe, no evidence of pleural effusion or pneumothorax. WBC count 21,000.  Potassium 3.1.   Assessment & Plan:   Right upper lobe cavitary pneumonia: Possible pneumonia due to MRSA. Will continue vancomycin , Flagyl  and Rocephin  today for broad-spectrum coverage given severity of symptoms. Chest physiotherapy, incentive spirometry, deep breathing exercises, sputum induction, mucolytic's and bronchodilators. Sputum cultures, blood cultures, Legionella and streptococcal antigen. MRSA swab positive. Supplemental oxygen to keep saturations more than 90%. Respiratory virus panel negative. Use adequate pain medications to help with respiratory therapy.  Hypokalemia: Persistent.  Also on hydrochlorothiazide  at home.  Replace aggressively and monitor levels.  Magnesium is adequate.  Hypertension: Blood pressure is stable.  Holding antihypertensives.      DVT prophylaxis: enoxaparin  (LOVENOX )  injection 40 mg Start: 04/11/24 2200   Code Status: Full code Family Communication: None today Disposition Plan: Status is: Inpatient Remains inpatient appropriate because: Significant infection, IV antibiotics     Consultants:  None  Procedures:  None  Antimicrobials:  Rocephin  11/23-- Vancomycin  11/23--- Doxycycline  11/23--11/24 Flagyl  11/24---     Objective: Vitals:   04/13/24 0000 04/13/24 0458 04/13/24 1032 04/13/24 1205  BP:  137/87 137/87 (!) 135/120  Pulse:  85 85 91  Resp:  17 17 18   Temp: (!) 102 F (38.9 C) 98.8 F (37.1 C) 98.8 F (37.1 C) 100.1 F (37.8 C)  TempSrc: Oral Oral Oral Oral  SpO2:  98%  95%  Weight:   77.1 kg   Height:   5' 10 (1.778 m)     Intake/Output Summary (Last 24 hours) at 04/13/2024 1304 Last data filed at 04/12/2024 1830 Gross per 24 hour  Intake 1350.33 ml  Output --  Net 1350.33 ml   Filed Weights   04/13/24 1032  Weight: 77.1 kg    Examination:  General exam: Mildly anxious.  In moderate distress when coughing with pleuritic pain. Respiratory system: Poor air entry on the right posterior base.  Poor inspiratory effort.  On room air. Cardiovascular system: S1 & S2 heard, RRR. No JVD, murmurs, rubs, gallops or clicks. No pedal edema. Gastrointestinal system: Abdomen is nondistended, soft and nontender. No organomegaly or masses felt. Normal bowel sounds heard. Central nervous system: Alert and oriented. No focal neurological deficits. Extremities: Symmetric 5 x 5 power. Skin: No rashes, lesions or ulcers Psychiatry: Judgement and insight appear normal. Mood & affect appropriate.     Data Reviewed: I have personally reviewed following labs and imaging studies  CBC: Recent Labs  Lab 04/11/24 1530 04/12/24 0546  04/13/24 0517  WBC 16.9* 19.4* 21.4*  NEUTROABS 12.6*  --   --   HGB 13.0 11.6* 10.7*  HCT 37.8 34.1* 32.5*  MCV 92.9 95.3 97.0  PLT 331 306 319   Basic Metabolic Panel: Recent Labs  Lab  04/11/24 1530 04/11/24 1641 04/12/24 0546 04/13/24 0517  NA 137  --  138 137  K 2.4*  --  2.9* 3.1*  CL 95*  --  101 102  CO2 29  --  26 24  GLUCOSE 110*  --  93 112*  BUN 6  --  5* 5*  CREATININE 0.67  --  0.60 0.61  CALCIUM 9.1  --  8.3* 8.4*  MG  --  2.1 2.1  --    GFR: Estimated Creatinine Clearance: 94 mL/min (by C-G formula based on SCr of 0.61 mg/dL). Liver Function Tests: Recent Labs  Lab 04/11/24 1530  AST 18  ALT 36  ALKPHOS 103  BILITOT 1.0  PROT 7.4  ALBUMIN 3.5   No results for input(s): LIPASE, AMYLASE in the last 168 hours. No results for input(s): AMMONIA in the last 168 hours. Coagulation Profile: Recent Labs  Lab 04/11/24 1716  INR 1.3*   Cardiac Enzymes: No results for input(s): CKTOTAL, CKMB, CKMBINDEX, TROPONINI in the last 168 hours. BNP (last 3 results) No results for input(s): PROBNP in the last 8760 hours. HbA1C: No results for input(s): HGBA1C in the last 72 hours. CBG: No results for input(s): GLUCAP in the last 168 hours. Lipid Profile: No results for input(s): CHOL, HDL, LDLCALC, TRIG, CHOLHDL, LDLDIRECT in the last 72 hours. Thyroid Function Tests: No results for input(s): TSH, T4TOTAL, FREET4, T3FREE, THYROIDAB in the last 72 hours. Anemia Panel: No results for input(s): VITAMINB12, FOLATE, FERRITIN, TIBC, IRON, RETICCTPCT in the last 72 hours. Sepsis Labs: Recent Labs  Lab 04/11/24 1535 04/11/24 2231 04/12/24 0546 04/13/24 0517  PROCALCITON  --  0.14 0.15 0.19  LATICACIDVEN 0.7  --   --   --     Recent Results (from the past 240 hours)  Culture, blood (Routine x 2)     Status: None (Preliminary result)   Collection Time: 04/11/24  3:30 PM   Specimen: BLOOD LEFT ARM  Result Value Ref Range Status   Specimen Description   Final    BLOOD LEFT ARM Performed at [facility], [address], [place], [place] [address]    Special Requests   Final    BOTTLES DRAWN  AEROBIC AND ANAEROBIC Blood Culture adequate volume Performed at [facility], [address] [place], [place] [address]    Culture   Final    NO GROWTH 2 DAYS Performed at [facility], [address] [place], [place] [address]    Report Status PENDING  Incomplete  Culture, blood (Routine x 2)     Status: None (Preliminary result)   Collection Time: 04/11/24  3:37 PM   Specimen: BLOOD RIGHT ARM  Result Value Ref Range Status   Specimen Description   Final    BLOOD RIGHT ARM Performed at [facility], [address] [place], [place] [address]    Special Requests   Final    BOTTLES DRAWN AEROBIC AND ANAEROBIC Blood Culture results may not be optimal due to an inadequate volume of blood received in culture bottles Performed at [facility], [address], [place], [place] [address]    Culture   Final  NO GROWTH 2 DAYS Performed at [facility], [address] [place], [place] [address]    Report Status PENDING  Incomplete  Resp panel by RT-PCR (RSV, Flu A&B, Covid) Anterior Nasal Swab     Status: None   Collection Time: 04/11/24  3:47 PM   Specimen: Anterior Nasal Swab  Result Value Ref Range Status   SARS Coronavirus 2 by RT PCR NEGATIVE NEGATIVE Final    Comment: (NOTE) SARS-CoV-2 target nucleic acids are NOT DETECTED.  The SARS-CoV-2 RNA is generally detectable in upper respiratory specimens during the acute phase of infection. The lowest concentration of SARS-CoV-2 viral copies this assay can detect is 138 copies/mL. A negative result does not preclude SARS-Cov-2 infection and should not be used as the sole basis for treatment or other patient management decisions. A negative result may occur with  improper specimen collection/handling, submission of specimen other than nasopharyngeal swab, presence of viral mutation(s) within the areas targeted by this assay, and inadequate number of viral copies(<138  copies/mL). A negative result must be combined with clinical observations, patient history, and epidemiological information. The expected result is Negative.  Fact Sheet for Patients:  bloggercourse.com  Fact Sheet for Healthcare Providers:  seriousbroker.it  This test is no t yet approved or cleared by the [place]  FDA and  has been authorized for detection and/or diagnosis of SARS-CoV-2 by FDA under an Emergency Use Authorization (EUA). This EUA will remain  in effect (meaning this test can be used) for the duration of the COVID-19 declaration under Section 564(b)(1) of the Act, 21 U.S.C.section 360bbb-3(b)(1), unless the authorization is terminated  or revoked sooner.       Influenza A by PCR NEGATIVE NEGATIVE Final   Influenza B by PCR NEGATIVE NEGATIVE Final    Comment: (NOTE) The Xpert Xpress SARS-CoV-2/FLU/RSV plus assay is intended as an aid in the diagnosis of influenza from Nasopharyngeal swab specimens and should not be used as a sole basis for treatment. Nasal washings and aspirates are unacceptable for Xpert Xpress SARS-CoV-2/FLU/RSV testing.  Fact Sheet for Patients: bloggercourse.com  Fact Sheet for Healthcare Providers: seriousbroker.it  This test is not yet approved or cleared by the [place]  FDA and has been authorized for detection and/or diagnosis of SARS-CoV-2 by FDA under an Emergency Use Authorization (EUA). This EUA will remain in effect (meaning this test can be used) for the duration of the COVID-19 declaration under Section 564(b)(1) of the Act, 21 U.S.C. section 360bbb-3(b)(1), unless the authorization is terminated or revoked.     Resp Syncytial Virus by PCR NEGATIVE NEGATIVE Final    Comment: (NOTE) Fact Sheet for Patients: bloggercourse.com  Fact Sheet for Healthcare  Providers: seriousbroker.it  This test is not yet approved or cleared by the [place]  FDA and has been authorized for detection and/or diagnosis of SARS-CoV-2 by FDA under an Emergency Use Authorization (EUA). This EUA will remain in effect (meaning this test can be used) for the duration of the COVID-19 declaration under Section 564(b)(1) of the Act, 21 U.S.C. section 360bbb-3(b)(1), unless the authorization is terminated or revoked.  Performed at [facility], [address], [place], [place] [address]   MRSA Next Gen by PCR, Nasal     Status: Abnormal   Collection Time: 04/11/24 10:34 PM   Specimen: Nasal Mucosa; Nasal Swab  Result Value Ref Range Status   MRSA by PCR Next Gen DETECTED (A) NOT DETECTED Final    Comment: CRITICAL RESULT CALLED TO, READ  BACK BY AND VERIFIED WITH: SHEN, P. EN AT 0222 04/12/2024 BY JEREMY C (NOTE) The GeneXpert MRSA Assay (FDA approved for NASAL specimens only), is one component of a comprehensive MRSA colonization surveillance program. It is not intended to diagnose MRSA infection nor to guide or monitor treatment for MRSA infections. Test performance is not FDA approved in patients less than 27 years old. Performed at [facility], [address], [place], [place] [address]   Expectorated Sputum Assessment w Gram Stain, Rflx to Resp Cult     Status: None   Collection Time: 04/11/24 11:26 PM   Specimen: Expectorated Sputum  Result Value Ref Range Status   Specimen Description EXPECTORATED SPUTUM  Final   Special Requests NONE  Final   Sputum evaluation   Final    THIS SPECIMEN IS ACCEPTABLE FOR SPUTUM CULTURE Performed at [facility], [address] [place], [place] [address]    Report Status 04/12/2024 FINAL  Final  Culture, Respiratory w Gram Stain     Status: None (Preliminary result)   Collection Time: 04/11/24 11:26 PM  Result Value Ref Range  Status   Specimen Description   Final    EXPECTORATED SPUTUM Performed at [facility], [address] [place], [place] [address]    Special Requests   Final    NONE Reflexed from [id number] Performed at [facility], [address], [place], [place] [address]    Gram Stain   Final    RARE WBC PRESENT,BOTH PMN AND MONONUCLEAR FEW GRAM POSITIVE COCCI IN PAIRS    Culture   Final    FEW STAPHYLOCOCCUS AUREUS SUSCEPTIBILITIES TO FOLLOW Performed at [facility], [address] [place], [place] [address]    Report Status PENDING  Incomplete         Radiology Studies: CT Angio Chest PE W and/or Wo Contrast Result Date: 04/11/2024 EXAM: CTA CHEST AORTA 04/11/2024 07:54:02 PM TECHNIQUE: CTA of the chest was performed without and with the administration of 75 mL of iohexol  (OMNIPAQUE ) 350 MG/ML injection. Multiplanar reformatted images are provided for review. MIP images are provided for review. Automated exposure control, iterative reconstruction, and/or weight based adjustment of the mA/kV was utilized to reduce the radiation dose to as low as reasonably achievable. COMPARISON: None available. CLINICAL HISTORY: Pulmonary embolism (PE) suspected, high prob. FINDINGS: AORTA: No thoracic aortic dissection. No aneurysm. MEDIASTINUM: No mediastinal lymphadenopathy. The heart and pericardium demonstrate no acute abnormality. LYMPH NODES: No mediastinal, hilar or axillary lymphadenopathy. LUNGS AND PLEURA: Dense consolidation in the right upper lobe with areas of cystic change/cavitation compatible with pneumonia with cavitation/necrosis. Plate-like atelectasis in the lingula. No pleural effusion or pneumothorax. UPPER ABDOMEN: Limited images of the upper abdomen are unremarkable. SOFT TISSUES AND BONES: No acute bone or soft tissue abnormality. IMPRESSION: 1. No evidence of pulmonary embolism. 2. Dense right upper lobe pneumonia with cavitation/necrosis. 3.  Plate-like atelectasis in the lingula. Electronically signed by: Franky Crease MD 04/11/2024 07:59 PM EST RP Workstation: [id number]   DG Chest 2 View if patient is not in a treatment room. Result Date: 04/11/2024 CLINICAL DATA:  Suspected sepsis. Recently diagnosed with pneumonia. Ongoing cough with shortness of breath and chest discomfort. Fever. EXAM: CHEST - 2 VIEW COMPARISON:  04/06/2024. FINDINGS: The heart size and mediastinal contours are within normal limits. There is increased consolidation in the right upper lobe with questionable air-fluid levels or cavitation within the consolidation. A few airspace opacities are noted  in the lower lung fields bilaterally. No effusion or pneumothorax is seen. No acute osseous abnormality. IMPRESSION: Interval worsening of right upper lobe consolidation with possible air-fluid levels or cavitation in this region. Additional airspace disease is seen at the lung bases. CT is recommended for further evaluation. Electronically Signed   By: Leita Birmingham M.D.   On: 04/11/2024 15:50        Scheduled Meds:  Chlorhexidine  Gluconate Cloth  6 each Topical Daily   cholecalciferol   1,000 Units Oral Daily   enoxaparin  (LOVENOX ) injection  40 mg Subcutaneous Q24H   mupirocin  ointment  1 Application Nasal BID   pantoprazole   40 mg Oral Daily   potassium chloride   40 mEq Oral BID   sodium chloride  flush  3 mL Intravenous Q12H   Continuous Infusions:  cefTRIAXone  (ROCEPHIN )  IV 2 g (04/12/24 2121)   metronidazole  500 mg (04/13/24 1138)   vancomycin  1,000 mg (04/13/24 0610)     LOS: 2 days      Renato Applebaum, MD [facility]

## 2024-04-14 LAB — CBC WITH DIFFERENTIAL/PLATELET
Abs Immature Granulocytes: 0.12 K/uL — ABNORMAL HIGH (ref 0.00–0.07)
Basophils Absolute: 0.1 K/uL (ref 0.0–0.1)
Basophils Relative: 0 %
Eosinophils Absolute: 0 K/uL (ref 0.0–0.5)
Eosinophils Relative: 0 %
HCT: 31.4 % — ABNORMAL LOW (ref 36.0–46.0)
Hemoglobin: 10.4 g/dL — ABNORMAL LOW (ref 12.0–15.0)
Immature Granulocytes: 1 %
Lymphocytes Relative: 13 %
Lymphs Abs: 2.8 K/uL (ref 0.7–4.0)
MCH: 32.1 pg (ref 26.0–34.0)
MCHC: 33.1 g/dL (ref 30.0–36.0)
MCV: 96.9 fL (ref 80.0–100.0)
Monocytes Absolute: 1.4 K/uL — ABNORMAL HIGH (ref 0.1–1.0)
Monocytes Relative: 7 %
Neutro Abs: 16.3 K/uL — ABNORMAL HIGH (ref 1.7–7.7)
Neutrophils Relative %: 79 %
Platelets: 337 K/uL (ref 150–400)
RBC: 3.24 MIL/uL — ABNORMAL LOW (ref 3.87–5.11)
RDW: 12 % (ref 11.5–15.5)
WBC: 20.6 K/uL — ABNORMAL HIGH (ref 4.0–10.5)
nRBC: 0 % (ref 0.0–0.2)

## 2024-04-14 LAB — BASIC METABOLIC PANEL WITH GFR
Anion gap: 10 (ref 5–15)
BUN: <5 mg/dL — ABNORMAL LOW (ref 6–20)
CO2: 24 mmol/L (ref 22–32)
Calcium: 8.3 mg/dL — ABNORMAL LOW (ref 8.9–10.3)
Chloride: 102 mmol/L (ref 98–111)
Creatinine, Ser: 0.55 mg/dL (ref 0.44–1.00)
GFR, Estimated: >60 mL/min (ref >60)
Glucose, Bld: 106 mg/dL — ABNORMAL HIGH (ref 70–99)
Potassium: 3.3 mmol/L — ABNORMAL LOW (ref 3.5–5.1)
Sodium: 136 mmol/L (ref 135–145)

## 2024-04-14 LAB — FUNGITELL BETA-D-GLUCAN: Fungitell Value:: <31.25 pg/mL

## 2024-04-14 LAB — CULTURE, RESPIRATORY W GRAM STAIN

## 2024-04-14 NOTE — Progress Notes (Signed)
 PROGRESS NOTE    Julie Smith  FMW:[id number] DOB: 1975/09/03 DOA: 04/11/2024 PCP: Pcp, No    Brief Narrative:  48 year old with history of hypertension, shortness of breath and cough for about a week, her granddaughter had URI symptoms and she had taken care of her.  No respiratory issues prior to 1 week.  Seen at urgent care on 11/18, diagnosed with pneumonia and started on azithromycin , Augmentin , prednisone  and Diflucan .  In the emergency room temperature 39.3, on room air, mildly tachycardic.  Blood pressure stable.  Potassium 2.4.  WBC 16.9 and normal lactate.  Chest x-ray and CT scan concerning with right upper and middle lobe cavitary pneumonia.  Admitted with IV antibiotics.  Subjective:  Patient seen and examined.  Her father at the bedside today.  Patient had been mostly been fever free for last 24 hours.  Tmax 100.  Able to take more deep breaths with less pleuritic pain today.    Assessment & Plan:   Right upper lobe cavitary pneumonia: Likely pneumonia due to MRSA:  Treated with vancomycin , Flagyl  and Rocephin .  Blood cultures negative.  Sputum cultures with Staph aureus. Continue vancomycin  and Flagyl  today.  Discontinue Rocephin . Chest physiotherapy, incentive spirometry, deep breathing exercises, sputum induction, mucolytic's and bronchodilators. MRSA swab positive. Supplemental oxygen to keep saturations more than 90%.  Mostly on room air. Respiratory virus panel negative. Use adequate pain medications to help with respiratory therapy.  Hypokalemia: Persistent.  Also on hydrochlorothiazide  at home.  Replace aggressively and monitor levels.  Magnesium is adequate.  Hypertension: Blood pressure is stable.  Holding HCTZ.      DVT prophylaxis: enoxaparin  (LOVENOX ) injection 40 mg Start: 04/11/24 2200   Code Status: Full code Family Communication: Father at the bedside. Disposition Plan: Status is: Inpatient Remains inpatient appropriate because: Significant  infection, IV antibiotics     Consultants:  None  Procedures:  None  Antimicrobials:  Rocephin  11/23--11/26 Vancomycin  11/23--- Doxycycline  11/23--11/24 Flagyl  11/24---     Objective: Vitals:   04/13/24 1205 04/13/24 1700 04/13/24 1944 04/14/24 0603  BP: (!) 135/120  (!) 148/93 (!) 143/69  Pulse: 91  84 75  Resp: 18  17 18   Temp: 100.1 F (37.8 C) 99.7 F (37.6 C) 100.2 F (37.9 C) 99.1 F (37.3 C)  TempSrc: Oral     SpO2: 95%  96% 97%  Weight:      Height:        Intake/Output Summary (Last 24 hours) at 04/14/2024 1248 Last data filed at 04/14/2024 0918 Gross per 24 hour  Intake 1332.96 ml  Output --  Net 1332.96 ml   Filed Weights   04/13/24 1032  Weight: 77.1 kg    Examination:  General exam: Fairly comfortable today. Respiratory system: Poor air entry on the right posterior base.  Good inspiratory effort.  On room air. Cardiovascular system: S1 & S2 heard, RRR. No JVD, murmurs, rubs, gallops or clicks. No pedal edema. Gastrointestinal system: Abdomen is nondistended, soft and nontender. No organomegaly or masses felt. Normal bowel sounds heard. Central nervous system: Alert and oriented. No focal neurological deficits. Extremities: Symmetric 5 x 5 power. Skin: No rashes, lesions or ulcers Psychiatry: Judgement and insight appear normal. Mood & affect appropriate.     Data Reviewed: I have personally reviewed following labs and imaging studies  CBC: Recent Labs  Lab 04/11/24 1530 04/12/24 0546 04/13/24 0517 04/14/24 0548  WBC 16.9* 19.4* 21.4* 20.6*  NEUTROABS 12.6*  --   --  16.3*  HGB  13.0 11.6* 10.7* 10.4*  HCT 37.8 34.1* 32.5* 31.4*  MCV 92.9 95.3 97.0 96.9  PLT 331 306 319 337   Basic Metabolic Panel: Recent Labs  Lab 04/11/24 1530 04/11/24 1641 04/12/24 0546 04/13/24 0517 04/14/24 0548  NA 137  --  138 137 136  K 2.4*  --  2.9* 3.1* 3.3*  CL 95*  --  101 102 102  CO2 29  --  26 24 24   GLUCOSE 110*  --  93 112* 106*   BUN 6  --  5* 5* <5*  CREATININE 0.67  --  0.60 0.61 0.55  CALCIUM 9.1  --  8.3* 8.4* 8.3*  MG  --  2.1 2.1  --   --    GFR: Estimated Creatinine Clearance: 94 mL/min (by C-G formula based on SCr of 0.55 mg/dL). Liver Function Tests: Recent Labs  Lab 04/11/24 1530  AST 18  ALT 36  ALKPHOS 103  BILITOT 1.0  PROT 7.4  ALBUMIN 3.5   No results for input(s): LIPASE, AMYLASE in the last 168 hours. No results for input(s): AMMONIA in the last 168 hours. Coagulation Profile: Recent Labs  Lab 04/11/24 1716  INR 1.3*   Cardiac Enzymes: No results for input(s): CKTOTAL, CKMB, CKMBINDEX, TROPONINI in the last 168 hours. BNP (last 3 results) No results for input(s): PROBNP in the last 8760 hours. HbA1C: No results for input(s): HGBA1C in the last 72 hours. CBG: No results for input(s): GLUCAP in the last 168 hours. Lipid Profile: No results for input(s): CHOL, HDL, LDLCALC, TRIG, CHOLHDL, LDLDIRECT in the last 72 hours. Thyroid Function Tests: No results for input(s): TSH, T4TOTAL, FREET4, T3FREE, THYROIDAB in the last 72 hours. Anemia Panel: No results for input(s): VITAMINB12, FOLATE, FERRITIN, TIBC, IRON, RETICCTPCT in the last 72 hours. Sepsis Labs: Recent Labs  Lab 04/11/24 1535 04/11/24 2231 04/12/24 0546 04/13/24 0517  PROCALCITON  --  0.14 0.15 0.19  LATICACIDVEN 0.7  --   --   --     Recent Results (from the past 240 hours)  Culture, blood (Routine x 2)     Status: None (Preliminary result)   Collection Time: 04/11/24  3:30 PM   Specimen: BLOOD LEFT ARM  Result Value Ref Range Status   Specimen Description   Final    BLOOD LEFT ARM Performed at [facility], [address], [place], [place] [address]    Special Requests   Final    BOTTLES DRAWN AEROBIC AND ANAEROBIC Blood Culture adequate volume Performed at [facility], [address] [place], [place] [address]    Culture    Final    NO GROWTH 3 DAYS Performed at [facility], [address] [place], [place] [address]    Report Status PENDING  Incomplete  Culture, blood (Routine x 2)     Status: None (Preliminary result)   Collection Time: 04/11/24  3:37 PM   Specimen: BLOOD RIGHT ARM  Result Value Ref Range Status   Specimen Description   Final    BLOOD RIGHT ARM Performed at [facility], [address] [place], [place] [address]    Special Requests   Final    BOTTLES DRAWN AEROBIC AND ANAEROBIC Blood Culture results may not be optimal due to an inadequate volume of blood received in culture bottles Performed at [facility], [address] [place], [place] [address]    Culture   Final    NO  GROWTH 3 DAYS Performed at [facility], [address] [place], [place] [address]    Report Status PENDING  Incomplete  Resp panel by RT-PCR (RSV, Flu A&B, Covid) Anterior Nasal Swab     Status: None   Collection Time: 04/11/24  3:47 PM   Specimen: Anterior Nasal Swab  Result Value Ref Range Status   SARS Coronavirus 2 by RT PCR NEGATIVE NEGATIVE Final    Comment: (NOTE) SARS-CoV-2 target nucleic acids are NOT DETECTED.  The SARS-CoV-2 RNA is generally detectable in upper respiratory specimens during the acute phase of infection. The lowest concentration of SARS-CoV-2 viral copies this assay can detect is 138 copies/mL. A negative result does not preclude SARS-Cov-2 infection and should not be used as the sole basis for treatment or other patient management decisions. A negative result may occur with  improper specimen collection/handling, submission of specimen other than nasopharyngeal swab, presence of viral mutation(s) within the areas targeted by this assay, and inadequate number of viral copies(<138 copies/mL). A negative result must be combined with clinical observations, patient history, and epidemiological information. The expected result is  Negative.  Fact Sheet for Patients:  bloggercourse.com  Fact Sheet for Healthcare Providers:  seriousbroker.it  This test is no t yet approved or cleared by the [place]  FDA and  has been authorized for detection and/or diagnosis of SARS-CoV-2 by FDA under an Emergency Use Authorization (EUA). This EUA will remain  in effect (meaning this test can be used) for the duration of the COVID-19 declaration under Section 564(b)(1) of the Act, 21 U.S.C.section 360bbb-3(b)(1), unless the authorization is terminated  or revoked sooner.       Influenza A by PCR NEGATIVE NEGATIVE Final   Influenza B by PCR NEGATIVE NEGATIVE Final    Comment: (NOTE) The Xpert Xpress SARS-CoV-2/FLU/RSV plus assay is intended as an aid in the diagnosis of influenza from Nasopharyngeal swab specimens and should not be used as a sole basis for treatment. Nasal washings and aspirates are unacceptable for Xpert Xpress SARS-CoV-2/FLU/RSV testing.  Fact Sheet for Patients: bloggercourse.com  Fact Sheet for Healthcare Providers: seriousbroker.it  This test is not yet approved or cleared by the [place]  FDA and has been authorized for detection and/or diagnosis of SARS-CoV-2 by FDA under an Emergency Use Authorization (EUA). This EUA will remain in effect (meaning this test can be used) for the duration of the COVID-19 declaration under Section 564(b)(1) of the Act, 21 U.S.C. section 360bbb-3(b)(1), unless the authorization is terminated or revoked.     Resp Syncytial Virus by PCR NEGATIVE NEGATIVE Final    Comment: (NOTE) Fact Sheet for Patients: bloggercourse.com  Fact Sheet for Healthcare Providers: seriousbroker.it  This test is not yet approved or cleared by the [place]  FDA and has been authorized for detection and/or diagnosis of  SARS-CoV-2 by FDA under an Emergency Use Authorization (EUA). This EUA will remain in effect (meaning this test can be used) for the duration of the COVID-19 declaration under Section 564(b)(1) of the Act, 21 U.S.C. section 360bbb-3(b)(1), unless the authorization is terminated or revoked.  Performed at [facility], [address], [place], [place] [address]   MRSA Next Gen by PCR, Nasal     Status: Abnormal   Collection Time: 04/11/24 10:34 PM   Specimen: Nasal Mucosa; Nasal Swab  Result Value Ref Range Status   MRSA by PCR Next Gen DETECTED (A) NOT DETECTED Final    Comment: CRITICAL RESULT CALLED TO, READ BACK  BY AND VERIFIED WITH: SHEN, P. EN AT 0222 04/12/2024 BY JEREMY C (NOTE) The GeneXpert MRSA Assay (FDA approved for NASAL specimens only), is one component of a comprehensive MRSA colonization surveillance program. It is not intended to diagnose MRSA infection nor to guide or monitor treatment for MRSA infections. Test performance is not FDA approved in patients less than 39 years old. Performed at [facility], [address], [place], [place] [address]   Expectorated Sputum Assessment w Gram Stain, Rflx to Resp Cult     Status: None   Collection Time: 04/11/24 11:26 PM   Specimen: Expectorated Sputum  Result Value Ref Range Status   Specimen Description EXPECTORATED SPUTUM  Final   Special Requests NONE  Final   Sputum evaluation   Final    THIS SPECIMEN IS ACCEPTABLE FOR SPUTUM CULTURE Performed at [facility], [address], [place], [place] [address]    Report Status 04/12/2024 FINAL  Final  Culture, Respiratory w Gram Stain     Status: None   Collection Time: 04/11/24 11:26 PM  Result Value Ref Range Status   Specimen Description   Final    EXPECTORATED SPUTUM Performed at [facility], [address] [place], [place] [address]    Special Requests   Final    NONE Reflexed from  [phone] Performed at [facility], [address] [place], [place] [address]    Gram Stain   Final    RARE WBC PRESENT,BOTH PMN AND MONONUCLEAR FEW GRAM POSITIVE COCCI IN PAIRS Performed at [facility], [address] [place], [place] [address]    Culture FEW METHICILLIN RESISTANT STAPHYLOCOCCUS AUREUS  Final   Report Status 04/14/2024 FINAL  Final   Organism ID, Bacteria METHICILLIN RESISTANT STAPHYLOCOCCUS AUREUS  Final      Susceptibility   Methicillin resistant staphylococcus aureus - MIC*    CIPROFLOXACIN  >=8 RESISTANT Resistant     ERYTHROMYCIN >=8 RESISTANT Resistant     GENTAMICIN <=0.5 SENSITIVE Sensitive     OXACILLIN >=4 RESISTANT Resistant     TETRACYCLINE <=1 SENSITIVE Sensitive     VANCOMYCIN  1 SENSITIVE Sensitive     TRIMETH/SULFA <=10 SENSITIVE Sensitive     CLINDAMYCIN  <=0.25 SENSITIVE Sensitive     RIFAMPIN <=0.5 SENSITIVE Sensitive     Inducible Clindamycin  NEGATIVE Sensitive     LINEZOLID  2 SENSITIVE Sensitive     * FEW METHICILLIN RESISTANT STAPHYLOCOCCUS AUREUS         Radiology Studies: DG CHEST PORT 1 VIEW Result Date: 04/13/2024 CLINICAL DATA:  Follow-up pneumonia. EXAM: PORTABLE CHEST 1 VIEW COMPARISON:  04/11/2024 chest radiographs and chest CTA. FINDINGS: Normal-sized heart. Progressively dense airspace opacity in the right upper lobe covering a slightly larger area. Clear left lung. No right pleural fluid. Unremarkable bones. IMPRESSION: Progressively dense right upper lobe pneumonia. Electronically Signed   By: Elspeth Bathe M.D.   On: 04/13/2024 15:41        Scheduled Meds:  Chlorhexidine  Gluconate Cloth  6 each Topical Daily   cholecalciferol   1,000 Units Oral Daily   enoxaparin  (LOVENOX ) injection  40 mg Subcutaneous Q24H   mupirocin  ointment  1 Application Nasal BID   pantoprazole   40 mg Oral Daily   potassium chloride   40 mEq Oral BID   sodium chloride  flush  3 mL Intravenous Q12H   Continuous Infusions:   metronidazole  500 mg (04/14/24 1128)   vancomycin  1,000 mg (04/14/24 0545)  LOS: 3 days      Renato Applebaum, MD [facility]

## 2024-04-14 NOTE — Plan of Care (Signed)

## 2024-04-14 NOTE — Plan of Care (Signed)
   Problem: Education: Goal: Knowledge of General Education information will improve Description: Including pain rating scale, medication(s)/side effects and non-pharmacologic comfort measures Outcome: Progressing   Problem: Pain Managment: Goal: General experience of comfort will improve and/or be controlled Outcome: Progressing   Problem: Safety: Goal: Ability to remain free from injury will improve Outcome: Progressing

## 2024-04-15 DIAGNOSIS — J15212 Pneumonia due to Methicillin resistant Staphylococcus aureus: Secondary | ICD-10-CM

## 2024-04-15 LAB — CBC WITH DIFFERENTIAL/PLATELET
Abs Immature Granulocytes: 0.07 K/uL (ref 0.00–0.07)
Basophils Absolute: 0 K/uL (ref 0.0–0.1)
Basophils Relative: 0 %
Eosinophils Absolute: 0 K/uL (ref 0.0–0.5)
Eosinophils Relative: 0 %
HCT: 31.2 % — ABNORMAL LOW (ref 36.0–46.0)
Hemoglobin: 10.2 g/dL — ABNORMAL LOW (ref 12.0–15.0)
Immature Granulocytes: 1 %
Lymphocytes Relative: 16 %
Lymphs Abs: 2.4 K/uL (ref 0.7–4.0)
MCH: 32 pg (ref 26.0–34.0)
MCHC: 32.7 g/dL (ref 30.0–36.0)
MCV: 97.8 fL (ref 80.0–100.0)
Monocytes Absolute: 1.2 K/uL — ABNORMAL HIGH (ref 0.1–1.0)
Monocytes Relative: 8 %
Neutro Abs: 11.4 K/uL — ABNORMAL HIGH (ref 1.7–7.7)
Neutrophils Relative %: 75 %
Platelets: 374 K/uL (ref 150–400)
RBC: 3.19 MIL/uL — ABNORMAL LOW (ref 3.87–5.11)
RDW: 12 % (ref 11.5–15.5)
WBC: 15.1 K/uL — ABNORMAL HIGH (ref 4.0–10.5)
nRBC: 0 % (ref 0.0–0.2)

## 2024-04-15 LAB — BASIC METABOLIC PANEL WITH GFR
Anion gap: 9 (ref 5–15)
BUN: <5 mg/dL — ABNORMAL LOW (ref 6–20)
CO2: 24 mmol/L (ref 22–32)
Calcium: 8.5 mg/dL — ABNORMAL LOW (ref 8.9–10.3)
Chloride: 105 mmol/L (ref 98–111)
Creatinine, Ser: 0.57 mg/dL (ref 0.44–1.00)
GFR, Estimated: >60 mL/min (ref >60)
Glucose, Bld: 105 mg/dL — ABNORMAL HIGH (ref 70–99)
Potassium: 3.7 mmol/L (ref 3.5–5.1)
Sodium: 138 mmol/L (ref 135–145)

## 2024-04-15 MED ORDER — POTASSIUM CHLORIDE CRYS ER 20 MEQ PO TBCR: 20.0000 meq | EXTENDED_RELEASE_TABLET | Freq: Every day | ORAL | 0 refills | Status: AC

## 2024-04-15 MED ORDER — LINEZOLID 600 MG PO TABS: 600.0000 mg | ORAL_TABLET | Freq: Two times a day (BID) | ORAL | 0 refills | Status: AC

## 2024-04-15 MED ORDER — LINEZOLID 600 MG PO TABS
600.0000 mg | ORAL_TABLET | Freq: Two times a day (BID) | ORAL | Status: DC
  Filled 2024-04-15: qty 1

## 2024-04-15 MED ORDER — METRONIDAZOLE 500 MG PO TABS: 500.0000 mg | ORAL_TABLET | Freq: Two times a day (BID) | ORAL | 0 refills | Status: AC

## 2024-04-15 MED ORDER — METRONIDAZOLE 500 MG PO TABS
500.0000 mg | ORAL_TABLET | Freq: Two times a day (BID) | ORAL | Status: DC
  Administered 2024-04-15: 500 mg via ORAL
  Filled 2024-04-15: qty 1

## 2024-04-15 MED ORDER — LINEZOLID 600 MG PO TABS
600.0000 mg | ORAL_TABLET | Freq: Once | ORAL | Status: AC
  Administered 2024-04-15: 600 mg via ORAL
  Filled 2024-04-15: qty 1

## 2024-04-15 MED ORDER — OXYCODONE HCL 5 MG PO TABS: 5.0000 mg | ORAL_TABLET | Freq: Four times a day (QID) | ORAL | 0 refills | Status: AC | PRN

## 2024-04-15 NOTE — Plan of Care (Signed)

## 2024-04-15 NOTE — Progress Notes (Signed)
 PROGRESS NOTE    Julie Smith  FMW:[id number] DOB: 10/31/1975 DOA: 04/11/2024 PCP: Pcp, No    Brief Narrative:  48 year old with history of hypertension, shortness of breath and cough for about a week, her granddaughter had URI symptoms and she had taken care of her.  No respiratory issues prior to 1 week.  Seen at urgent care on 11/18, diagnosed with pneumonia and started on azithromycin , Augmentin , prednisone  and Diflucan .  In the emergency room temperature 39.3, on room air, mildly tachycardic.  Blood pressure stable.  Potassium 2.4.  WBC 16.9 and normal lactate.  Chest x-ray and CT scan concerning with right upper and middle lobe cavitary pneumonia.  Admitted with IV antibiotics.  Subjective:  Patient seen and examined.  Breathing and pleuritic chest pain is improving.  Remains afebrile.  WBC count 15. Patient had multiple diarrhea episodes today that is mostly watery. Changing to oral antibiotics to complete 10 days of therapy with Zyvox  and metronidazole .  If patient remains well-controlled symptoms by later today, will discharge home with oral antibiotics.    Assessment & Plan:   Right upper lobe cavitary pneumonia secondary to MRSA pneumonia. Treated with vancomycin , Flagyl  and Rocephin .  Blood cultures negative.  Sputum cultures with MRSA. Clinically improving. Zyvox  600 mg twice daily, total 10 days of antibiotic therapy. Given cavitary pneumonia, will also treat with 10 days of Flagyl . Continue chest physiotherapy and incentive telemetry, deep breathing exercises.  She will continue to do this at home also.  Hypokalemia: Persistent.  Also on hydrochlorothiazide  at home.  Replace potassium.  Hypertension: Blood pressure is stable.  Holding HCTZ.  Can resume on discharge along with potassium.      DVT prophylaxis: enoxaparin  (LOVENOX ) injection 40 mg Start: 04/11/24 2200   Code Status: Full code Family Communication: None today. Disposition Plan: Status is:  Inpatient Remains inpatient appropriate because: With significant diarrhea.  Will monitor for discharge readiness either later today or tomorrow morning.     Consultants:  None  Procedures:  None  Antimicrobials:  Rocephin  11/23--11/26 Vancomycin  11/23--- 11/27 Zyvox  11/27--- Doxycycline  11/23--11/24 Flagyl  11/24---     Objective: Vitals:   04/13/24 1944 04/14/24 0603 04/14/24 1956 04/15/24 0433  BP: (!) 148/93 (!) 143/69 (!) 147/93 124/71  Pulse: 84 75 78 68  Resp: 17 18 16 16   Temp: 100.2 F (37.9 C) 99.1 F (37.3 C) 98.9 F (37.2 C) 99 F (37.2 C)  TempSrc:      SpO2: 96% 97% 99% 100%  Weight:      Height:        Intake/Output Summary (Last 24 hours) at 04/15/2024 1112 Last data filed at 04/15/2024 0948 Gross per 24 hour  Intake 600 ml  Output --  Net 600 ml   Filed Weights   04/13/24 1032  Weight: 77.1 kg    Examination:  General exam: Comfortable.  On room air. Respiratory system: No added sounds.  Good air entry bilateral and good inspiratory effort. Cardiovascular system: S1 & S2 heard, RRR. No JVD, murmurs, rubs, gallops or clicks. No pedal edema. Gastrointestinal system: Abdomen is nondistended, soft and nontender. No organomegaly or masses felt. Normal bowel sounds heard. Central nervous system: Alert and oriented. No focal neurological deficits. Extremities: Symmetric 5 x 5 power. Skin: No rashes, lesions or ulcers Psychiatry: Judgement and insight appear normal. Mood & affect appropriate.     Data Reviewed: I have personally reviewed following labs and imaging studies  CBC: Recent Labs  Lab 04/11/24 1530 04/12/24 0546  04/13/24 0517 04/14/24 0548 04/15/24 0515  WBC 16.9* 19.4* 21.4* 20.6* 15.1*  NEUTROABS 12.6*  --   --  16.3* 11.4*  HGB 13.0 11.6* 10.7* 10.4* 10.2*  HCT 37.8 34.1* 32.5* 31.4* 31.2*  MCV 92.9 95.3 97.0 96.9 97.8  PLT 331 306 319 337 374   Basic Metabolic Panel: Recent Labs  Lab 04/11/24 1530 04/11/24 1641  04/12/24 0546 04/13/24 0517 04/14/24 0548 04/15/24 0515  NA 137  --  138 137 136 138  K 2.4*  --  2.9* 3.1* 3.3* 3.7  CL 95*  --  101 102 102 105  CO2 29  --  26 24 24 24   GLUCOSE 110*  --  93 112* 106* 105*  BUN 6  --  5* 5* <5* <5*  CREATININE 0.67  --  0.60 0.61 0.55 0.57  CALCIUM 9.1  --  8.3* 8.4* 8.3* 8.5*  MG  --  2.1 2.1  --   --   --    GFR: Estimated Creatinine Clearance: 94 mL/min (by C-G formula based on SCr of 0.57 mg/dL). Liver Function Tests: Recent Labs  Lab 04/11/24 1530  AST 18  ALT 36  ALKPHOS 103  BILITOT 1.0  PROT 7.4  ALBUMIN 3.5   No results for input(s): LIPASE, AMYLASE in the last 168 hours. No results for input(s): AMMONIA in the last 168 hours. Coagulation Profile: Recent Labs  Lab 04/11/24 1716  INR 1.3*   Cardiac Enzymes: No results for input(s): CKTOTAL, CKMB, CKMBINDEX, TROPONINI in the last 168 hours. BNP (last 3 results) No results for input(s): PROBNP in the last 8760 hours. HbA1C: No results for input(s): HGBA1C in the last 72 hours. CBG: No results for input(s): GLUCAP in the last 168 hours. Lipid Profile: No results for input(s): CHOL, HDL, LDLCALC, TRIG, CHOLHDL, LDLDIRECT in the last 72 hours. Thyroid Function Tests: No results for input(s): TSH, T4TOTAL, FREET4, T3FREE, THYROIDAB in the last 72 hours. Anemia Panel: No results for input(s): VITAMINB12, FOLATE, FERRITIN, TIBC, IRON, RETICCTPCT in the last 72 hours. Sepsis Labs: Recent Labs  Lab 04/11/24 1535 04/11/24 2231 04/12/24 0546 04/13/24 0517  PROCALCITON  --  0.14 0.15 0.19  LATICACIDVEN 0.7  --   --   --     Recent Results (from the past 240 hours)  Culture, blood (Routine x 2)     Status: None (Preliminary result)   Collection Time: 04/11/24  3:30 PM   Specimen: BLOOD LEFT ARM  Result Value Ref Range Status   Specimen Description   Final    BLOOD LEFT ARM Performed at [facility], [address], [place], [place] [address]    Special Requests   Final    BOTTLES DRAWN AEROBIC AND ANAEROBIC Blood Culture adequate volume Performed at [facility], [address] [place], [place] [address]    Culture   Final    NO GROWTH 4 DAYS Performed at [facility], [address] [place], [place] [address]    Report Status PENDING  Incomplete  Culture, blood (Routine x 2)     Status: None (Preliminary result)   Collection Time: 04/11/24  3:37 PM   Specimen: BLOOD RIGHT ARM  Result Value Ref Range Status   Specimen Description   Final    BLOOD RIGHT ARM Performed at [facility], [address], [place], [place] [address]    Special Requests   Final    BOTTLES DRAWN AEROBIC AND  ANAEROBIC Blood Culture results may not be optimal due to an inadequate volume of blood received in culture bottles Performed at [facility], [address] [place], [place] [address]    Culture   Final    NO GROWTH 4 DAYS Performed at [facility], [address] [place], [place] [address]    Report Status PENDING  Incomplete  Resp panel by RT-PCR (RSV, Flu A&B, Covid) Anterior Nasal Swab     Status: None   Collection Time: 04/11/24  3:47 PM   Specimen: Anterior Nasal Swab  Result Value Ref Range Status   SARS Coronavirus 2 by RT PCR NEGATIVE NEGATIVE Final    Comment: (NOTE) SARS-CoV-2 target nucleic acids are NOT DETECTED.  The SARS-CoV-2 RNA is generally detectable in upper respiratory specimens during the acute phase of infection. The lowest concentration of SARS-CoV-2 viral copies this assay can detect is 138 copies/mL. A negative result does not preclude SARS-Cov-2 infection and should not be used as the sole basis for treatment or other patient management decisions. A negative result may occur with  improper specimen collection/handling, submission of specimen other than nasopharyngeal swab, presence of viral mutation(s) within  the areas targeted by this assay, and inadequate number of viral copies(<138 copies/mL). A negative result must be combined with clinical observations, patient history, and epidemiological information. The expected result is Negative.  Fact Sheet for Patients:  bloggercourse.com  Fact Sheet for Healthcare Providers:  seriousbroker.it  This test is no t yet approved or cleared by the [place]  FDA and  has been authorized for detection and/or diagnosis of SARS-CoV-2 by FDA under an Emergency Use Authorization (EUA). This EUA will remain  in effect (meaning this test can be used) for the duration of the COVID-19 declaration under Section 564(b)(1) of the Act, 21 U.S.C.section 360bbb-3(b)(1), unless the authorization is terminated  or revoked sooner.       Influenza A by PCR NEGATIVE NEGATIVE Final   Influenza B by PCR NEGATIVE NEGATIVE Final    Comment: (NOTE) The Xpert Xpress SARS-CoV-2/FLU/RSV plus assay is intended as an aid in the diagnosis of influenza from Nasopharyngeal swab specimens and should not be used as a sole basis for treatment. Nasal washings and aspirates are unacceptable for Xpert Xpress SARS-CoV-2/FLU/RSV testing.  Fact Sheet for Patients: bloggercourse.com  Fact Sheet for Healthcare Providers: seriousbroker.it  This test is not yet approved or cleared by the [place]  FDA and has been authorized for detection and/or diagnosis of SARS-CoV-2 by FDA under an Emergency Use Authorization (EUA). This EUA will remain in effect (meaning this test can be used) for the duration of the COVID-19 declaration under Section 564(b)(1) of the Act, 21 U.S.C. section 360bbb-3(b)(1), unless the authorization is terminated or revoked.     Resp Syncytial Virus by PCR NEGATIVE NEGATIVE Final    Comment: (NOTE) Fact Sheet for  Patients: bloggercourse.com  Fact Sheet for Healthcare Providers: seriousbroker.it  This test is not yet approved or cleared by the [place]  FDA and has been authorized for detection and/or diagnosis of SARS-CoV-2 by FDA under an Emergency Use Authorization (EUA). This EUA will remain in effect (meaning this test can be used) for the duration of the COVID-19 declaration under Section 564(b)(1) of the Act, 21 U.S.C. section 360bbb-3(b)(1), unless the authorization is terminated or revoked.  Performed at [facility], [address] [place], [place] [address]   MRSA Next Gen by PCR, Nasal     Status:  Abnormal   Collection Time: 04/11/24 10:34 PM   Specimen: Nasal Mucosa; Nasal Swab  Result Value Ref Range Status   MRSA by PCR Next Gen DETECTED (A) NOT DETECTED Final    Comment: CRITICAL RESULT CALLED TO, READ BACK BY AND VERIFIED WITH: SHEN, P. EN AT 0222 04/12/2024 BY JEREMY C (NOTE) The GeneXpert MRSA Assay (FDA approved for NASAL specimens only), is one component of a comprehensive MRSA colonization surveillance program. It is not intended to diagnose MRSA infection nor to guide or monitor treatment for MRSA infections. Test performance is not FDA approved in patients less than 69 years old. Performed at [facility], [address] [place], [place] [address]   Expectorated Sputum Assessment w Gram Stain, Rflx to Resp Cult     Status: None   Collection Time: 04/11/24 11:26 PM   Specimen: Expectorated Sputum  Result Value Ref Range Status   Specimen Description EXPECTORATED SPUTUM  Final   Special Requests NONE  Final   Sputum evaluation   Final    THIS SPECIMEN IS ACCEPTABLE FOR SPUTUM CULTURE Performed at [facility], [address] [place], [place] [address]    Report Status 04/12/2024 FINAL  Final  Culture, Respiratory w Gram Stain     Status: None    Collection Time: 04/11/24 11:26 PM  Result Value Ref Range Status   Specimen Description   Final    EXPECTORATED SPUTUM Performed at [facility], [address] [place], [place] [address]    Special Requests   Final    NONE Reflexed from [id number] Performed at [facility], [address], [place], [place] [address]    Gram Stain   Final    RARE WBC PRESENT,BOTH PMN AND MONONUCLEAR FEW GRAM POSITIVE COCCI IN PAIRS Performed at [facility], [address] [place], [place] [address]    Culture FEW METHICILLIN RESISTANT STAPHYLOCOCCUS AUREUS  Final   Report Status 04/14/2024 FINAL  Final   Organism ID, Bacteria METHICILLIN RESISTANT STAPHYLOCOCCUS AUREUS  Final      Susceptibility   Methicillin resistant staphylococcus aureus - MIC*    CIPROFLOXACIN  >=8 RESISTANT Resistant     ERYTHROMYCIN >=8 RESISTANT Resistant     GENTAMICIN <=0.5 SENSITIVE Sensitive     OXACILLIN >=4 RESISTANT Resistant     TETRACYCLINE <=1 SENSITIVE Sensitive     VANCOMYCIN  1 SENSITIVE Sensitive     TRIMETH/SULFA <=10 SENSITIVE Sensitive     CLINDAMYCIN  <=0.25 SENSITIVE Sensitive     RIFAMPIN <=0.5 SENSITIVE Sensitive     Inducible Clindamycin  NEGATIVE Sensitive     LINEZOLID  2 SENSITIVE Sensitive     * FEW METHICILLIN RESISTANT STAPHYLOCOCCUS AUREUS         Radiology Studies: No results found.       Scheduled Meds:  Chlorhexidine  Gluconate Cloth  6 each Topical Daily   cholecalciferol   1,000 Units Oral Daily   enoxaparin  (LOVENOX ) injection  40 mg Subcutaneous Q24H   linezolid   600 mg Oral Q12H   metroNIDAZOLE   500 mg Oral Q12H   mupirocin  ointment  1 Application Nasal BID   pantoprazole   40 mg Oral Daily   potassium chloride   40 mEq Oral BID   sodium chloride  flush  3 mL Intravenous Q12H   Continuous Infusions:     LOS: 4 days      Renato Applebaum, MD [facility]

## 2024-04-15 NOTE — Discharge Summary (Addendum)
 Physician Discharge Summary  Julie Smith FMW:[id number] DOB: 09/26/1975 DOA: 04/11/2024  PCP: Pcp, No  Admit date: 04/11/2024 Discharge date: 04/16/2024 Addendum done to correct documentation for discharge date as 04/15/2024   Admitted From: Home Disposition: Home  Recommendations for Outpatient Follow-up:  Follow up with PCP in 1-2 weeks Please obtain BMP/CBC in one week   Discharge Condition: Stable CODE STATUS: Full code Diet recommendation: Regular diet  Discharge summary: 48 year old hypertension presented with about 1 week of shortness of breath and cough, high-grade fever.  In the emergency room right upper lobe cavitary pneumonia, potassium 2.4, WBC count 16.9-21,000, normal lactic.  Admitted and treated as right upper lobe cavitary pneumonia.  Blood cultures were negative.  Sputum culture with MRSA.  Received 4 days of vancomycin  and Flagyl  along with 2 days of doxycycline  and Rocephin .  Clinically improved.  WBC count trending down.  Afebrile.  Right upper lobe cavitary pneumonia secondary to MRSA pneumonia. Treated with vancomycin , Flagyl  and Rocephin .  Blood cultures negative.  Sputum cultures with MRSA. Clinically improving. Zyvox  600 mg twice daily, total 10 days of antibiotic therapy. Given cavitary pneumonia, will also treat with 10 days of Flagyl . Continue chest physiotherapy and incentive telemetry, deep breathing exercises.  She will continue to do this at home also.  Will benefit with repeat chest x-ray in 3 to 4 weeks.   Hypokalemia: Treated aggressively.  Improved.  Going home with potassium supplementation.  Can resume hydrochlorothiazide  now.  Stable to discharge home with outpatient follow-up.      Discharge Diagnoses:  Principal Problem:   Pneumonia Active Problems:   Hypertension   Hypokalemia    Discharge Instructions  Discharge Instructions     Diet general   Complete by: As directed    Increase activity slowly   Complete by: As  directed       Allergies as of 04/15/2024       Reactions   Codeine Other (See Comments)   Hallucinations         Medication List     STOP taking these medications    amoxicillin -clavulanate 875-125 MG tablet Commonly known as: AUGMENTIN    azithromycin  250 MG tablet Commonly known as: ZITHROMAX        TAKE these medications    albuterol  108 (90 Base) MCG/ACT inhaler Commonly known as: VENTOLIN  HFA Inhale 1-2 puffs into the lungs every 6 (six) hours as needed for wheezing or shortness of breath.   ascorbic acid 500 MG tablet Commonly known as: VITAMIN C Take 500 mg by mouth daily.   ASHWAGANDHA PO Take 1 tablet by mouth daily.   benzonatate  100 MG capsule Commonly known as: TESSALON  Take 1 capsule (100 mg total) by mouth every 8 (eight) hours.   cholecalciferol  25 MCG (1000 UNIT) tablet Commonly known as: VITAMIN D3 Take 1,000 Units by mouth daily.   esomeprazole 20 MG capsule Commonly known as: NEXIUM Take 20 mg by mouth daily at 12 noon.   ibuprofen  200 MG tablet Commonly known as: ADVIL  Take 400 mg by mouth every 6 (six) hours as needed for mild pain (pain score 1-3).   linezolid  600 MG tablet Commonly known as: ZYVOX  Take 1 tablet (600 mg total) by mouth every 12 (twelve) hours for 6 days.   metroNIDAZOLE  500 MG tablet Commonly known as: FLAGYL  Take 1 tablet (500 mg total) by mouth every 12 (twelve) hours for 6 days.   multivitamin with minerals Tabs tablet Take 1 tablet by mouth daily.   ondansetron   4 MG disintegrating tablet Commonly known as: ZOFRAN -ODT Take 1 tablet (4 mg total) by mouth every 8 (eight) hours as needed for nausea or vomiting.   oxyCODONE  5 MG immediate release tablet Commonly known as: Oxy IR/ROXICODONE  Take 1 tablet (5 mg total) by mouth every 6 (six) hours as needed for up to 3 days for moderate pain (pain score 4-6).   potassium chloride  SA 20 MEQ tablet Commonly known as: KLOR-CON  M Take 1 tablet (20 mEq total)  by mouth daily for 5 days.   PROBIOTIC DAILY PO Take 1 tablet by mouth daily.        Allergies  Allergen Reactions   Codeine Other (See Comments)    Hallucinations     Consultations: None   Procedures/Studies: DG CHEST PORT 1 VIEW Result Date: 04/13/2024 CLINICAL DATA:  Follow-up pneumonia. EXAM: PORTABLE CHEST 1 VIEW COMPARISON:  04/11/2024 chest radiographs and chest CTA. FINDINGS: Normal-sized heart. Progressively dense airspace opacity in the right upper lobe covering a slightly larger area. Clear left lung. No right pleural fluid. Unremarkable bones. IMPRESSION: Progressively dense right upper lobe pneumonia. Electronically Signed   By: Elspeth Bathe M.D.   On: 04/13/2024 15:41   CT Angio Chest PE W and/or Wo Contrast Result Date: 04/11/2024 EXAM: CTA CHEST AORTA 04/11/2024 07:54:02 PM TECHNIQUE: CTA of the chest was performed without and with the administration of 75 mL of iohexol  (OMNIPAQUE ) 350 MG/ML injection. Multiplanar reformatted images are provided for review. MIP images are provided for review. Automated exposure control, iterative reconstruction, and/or weight based adjustment of the mA/kV was utilized to reduce the radiation dose to as low as reasonably achievable. COMPARISON: None available. CLINICAL HISTORY: Pulmonary embolism (PE) suspected, high prob. FINDINGS: AORTA: No thoracic aortic dissection. No aneurysm. MEDIASTINUM: No mediastinal lymphadenopathy. The heart and pericardium demonstrate no acute abnormality. LYMPH NODES: No mediastinal, hilar or axillary lymphadenopathy. LUNGS AND PLEURA: Dense consolidation in the right upper lobe with areas of cystic change/cavitation compatible with pneumonia with cavitation/necrosis. Plate-like atelectasis in the lingula. No pleural effusion or pneumothorax. UPPER ABDOMEN: Limited images of the upper abdomen are unremarkable. SOFT TISSUES AND BONES: No acute bone or soft tissue abnormality. IMPRESSION: 1. No evidence of  pulmonary embolism. 2. Dense right upper lobe pneumonia with cavitation/necrosis. 3. Plate-like atelectasis in the lingula. Electronically signed by: Franky Crease MD 04/11/2024 07:59 PM EST RP Workstation: [id number]   DG Chest 2 View if patient is not in a treatment room. Result Date: 04/11/2024 CLINICAL DATA:  Suspected sepsis. Recently diagnosed with pneumonia. Ongoing cough with shortness of breath and chest discomfort. Fever. EXAM: CHEST - 2 VIEW COMPARISON:  04/06/2024. FINDINGS: The heart size and mediastinal contours are within normal limits. There is increased consolidation in the right upper lobe with questionable air-fluid levels or cavitation within the consolidation. A few airspace opacities are noted in the lower lung fields bilaterally. No effusion or pneumothorax is seen. No acute osseous abnormality. IMPRESSION: Interval worsening of right upper lobe consolidation with possible air-fluid levels or cavitation in this region. Additional airspace disease is seen at the lung bases. CT is recommended for further evaluation. Electronically Signed   By: Leita Birmingham M.D.   On: 04/11/2024 15:50   DG Chest 2 View Result Date: 04/06/2024 CLINICAL DATA:  Hemoptysis and right upper rib pain. EXAM: CHEST - 2 VIEW COMPARISON:  September 07, 2019 FINDINGS: The heart size and mediastinal contours are within normal limits. There is moderate to marked severity inferolateral right upper lobe infiltrate. No  pleural effusion or pneumothorax is identified. The visualized skeletal structures are unremarkable. IMPRESSION: Moderate to marked severity right upper lobe infiltrate. Follow-up to resolution is recommended to exclude the presence of an underlying neoplastic process. Electronically Signed   By: Suzen Dials M.D.   On: 04/06/2024 17:06   (Echo, Carotid, EGD, Colonoscopy, ERCP)    Subjective: Patient seen and examined in the morning rounds.  Mild pleuritic pain on deep breathing.  Afebrile.  Had some  diarrhea in the morning after vancomycin  but improved later on.  Comfortable with plan to go home.  Started on oral antibiotics and given first dose of antibiotics in the hospital.   Discharge Exam: Vitals:   04/14/24 1956 04/15/24 0433  BP: (!) 147/93 124/71  Pulse: 78 68  Resp: 16 16  Temp: 98.9 F (37.2 C) 99 F (37.2 C)  SpO2: 99% 100%   Vitals:   04/13/24 1944 04/14/24 0603 04/14/24 1956 04/15/24 0433  BP: (!) 148/93 (!) 143/69 (!) 147/93 124/71  Pulse: 84 75 78 68  Resp: 17 18 16 16   Temp: 100.2 F (37.9 C) 99.1 F (37.3 C) 98.9 F (37.2 C) 99 F (37.2 C)  TempSrc:      SpO2: 96% 97% 99% 100%  Weight:      Height:        General: Pt is alert, awake, not in acute distress Cardiovascular: RRR, S1/S2 +, no rubs, no gallops Respiratory: CTA bilaterally, no wheezing, no rhonchi, good bilateral air entry.  No added sounds.  Good inspiratory effort. Abdominal: Soft, NT, ND, bowel sounds + Extremities: no edema, no cyanosis    The results of significant diagnostics from this hospitalization (including imaging, microbiology, ancillary and laboratory) are listed below for reference.     Microbiology: Recent Results (from the past 240 hours)  Culture, blood (Routine x 2)     Status: None (Preliminary result)   Collection Time: 04/11/24  3:30 PM   Specimen: BLOOD LEFT ARM  Result Value Ref Range Status   Specimen Description   Final    BLOOD LEFT ARM Performed at [facility], [address] [place], [place] [address]    Special Requests   Final    BOTTLES DRAWN AEROBIC AND ANAEROBIC Blood Culture adequate volume Performed at [facility], [address], [place], [place] [address]    Culture   Final    NO GROWTH 4 DAYS Performed at [facility], [address] [place], [place] [address]    Report Status PENDING  Incomplete  Culture, blood (Routine x 2)     Status: None (Preliminary result)   Collection Time: 04/11/24  3:37 PM    Specimen: BLOOD RIGHT ARM  Result Value Ref Range Status   Specimen Description   Final    BLOOD RIGHT ARM Performed at [facility], [address] [place], [place] [address]    Special Requests   Final    BOTTLES DRAWN AEROBIC AND ANAEROBIC Blood Culture results may not be optimal due to an inadequate volume of blood received in culture bottles Performed at [facility], [address] [place], [place] [address]    Culture   Final    NO GROWTH 4 DAYS Performed at [facility], [address], [place], [place] [address]    Report Status PENDING  Incomplete  Resp panel by RT-PCR (RSV, Flu A&B, Covid) Anterior Nasal Swab     Status: None  Collection Time: 04/11/24  3:47 PM   Specimen: Anterior Nasal Swab  Result Value Ref Range Status   SARS Coronavirus 2 by RT PCR NEGATIVE NEGATIVE Final    Comment: (NOTE) SARS-CoV-2 target nucleic acids are NOT DETECTED.  The SARS-CoV-2 RNA is generally detectable in upper respiratory specimens during the acute phase of infection. The lowest concentration of SARS-CoV-2 viral copies this assay can detect is 138 copies/mL. A negative result does not preclude SARS-Cov-2 infection and should not be used as the sole basis for treatment or other patient management decisions. A negative result may occur with  improper specimen collection/handling, submission of specimen other than nasopharyngeal swab, presence of viral mutation(s) within the areas targeted by this assay, and inadequate number of viral copies(<138 copies/mL). A negative result must be combined with clinical observations, patient history, and epidemiological information. The expected result is Negative.  Fact Sheet for Patients:  bloggercourse.com  Fact Sheet for Healthcare Providers:  seriousbroker.it  This test is no t yet approved or cleared by the [place]  FDA and  has been authorized for  detection and/or diagnosis of SARS-CoV-2 by FDA under an Emergency Use Authorization (EUA). This EUA will remain  in effect (meaning this test can be used) for the duration of the COVID-19 declaration under Section 564(b)(1) of the Act, 21 U.S.C.section 360bbb-3(b)(1), unless the authorization is terminated  or revoked sooner.       Influenza A by PCR NEGATIVE NEGATIVE Final   Influenza B by PCR NEGATIVE NEGATIVE Final    Comment: (NOTE) The Xpert Xpress SARS-CoV-2/FLU/RSV plus assay is intended as an aid in the diagnosis of influenza from Nasopharyngeal swab specimens and should not be used as a sole basis for treatment. Nasal washings and aspirates are unacceptable for Xpert Xpress SARS-CoV-2/FLU/RSV testing.  Fact Sheet for Patients: bloggercourse.com  Fact Sheet for Healthcare Providers: seriousbroker.it  This test is not yet approved or cleared by the [place]  FDA and has been authorized for detection and/or diagnosis of SARS-CoV-2 by FDA under an Emergency Use Authorization (EUA). This EUA will remain in effect (meaning this test can be used) for the duration of the COVID-19 declaration under Section 564(b)(1) of the Act, 21 U.S.C. section 360bbb-3(b)(1), unless the authorization is terminated or revoked.     Resp Syncytial Virus by PCR NEGATIVE NEGATIVE Final    Comment: (NOTE) Fact Sheet for Patients: bloggercourse.com  Fact Sheet for Healthcare Providers: seriousbroker.it  This test is not yet approved or cleared by the [place]  FDA and has been authorized for detection and/or diagnosis of SARS-CoV-2 by FDA under an Emergency Use Authorization (EUA). This EUA will remain in effect (meaning this test can be used) for the duration of the COVID-19 declaration under Section 564(b)(1) of the Act, 21 U.S.C. section 360bbb-3(b)(1), unless the authorization is  terminated or revoked.  Performed at [facility], [address] [place], [place] [address]   MRSA Next Gen by PCR, Nasal     Status: Abnormal   Collection Time: 04/11/24 10:34 PM   Specimen: Nasal Mucosa; Nasal Swab  Result Value Ref Range Status   MRSA by PCR Next Gen DETECTED (A) NOT DETECTED Final    Comment: CRITICAL RESULT CALLED TO, READ BACK BY AND VERIFIED WITH: SHEN, P. EN AT 0222 04/12/2024 BY JEREMY C (NOTE) The GeneXpert MRSA Assay (FDA approved for NASAL specimens only), is one component of a comprehensive MRSA colonization surveillance program. It is not intended to diagnose MRSA infection nor to  guide or monitor treatment for MRSA infections. Test performance is not FDA approved in patients less than 58 years old. Performed at [facility], [address], [place], [place] [address]   Expectorated Sputum Assessment w Gram Stain, Rflx to Resp Cult     Status: None   Collection Time: 04/11/24 11:26 PM   Specimen: Expectorated Sputum  Result Value Ref Range Status   Specimen Description EXPECTORATED SPUTUM  Final   Special Requests NONE  Final   Sputum evaluation   Final    THIS SPECIMEN IS ACCEPTABLE FOR SPUTUM CULTURE Performed at [facility], [address], [place], [place] [address]    Report Status 04/12/2024 FINAL  Final  Culture, Respiratory w Gram Stain     Status: None   Collection Time: 04/11/24 11:26 PM  Result Value Ref Range Status   Specimen Description   Final    EXPECTORATED SPUTUM Performed at [facility], [address], [place], [place] [address]    Special Requests   Final    NONE Reflexed from [id number] Performed at [facility], [address] [place], [place] [address]    Gram Stain   Final    RARE WBC PRESENT,BOTH PMN AND MONONUCLEAR FEW GRAM POSITIVE COCCI IN PAIRS Performed at [facility], [address] [place], [place]  [address]    Culture FEW METHICILLIN RESISTANT STAPHYLOCOCCUS AUREUS  Final   Report Status 04/14/2024 FINAL  Final   Organism ID, Bacteria METHICILLIN RESISTANT STAPHYLOCOCCUS AUREUS  Final      Susceptibility   Methicillin resistant staphylococcus aureus - MIC*    CIPROFLOXACIN  >=8 RESISTANT Resistant     ERYTHROMYCIN >=8 RESISTANT Resistant     GENTAMICIN <=0.5 SENSITIVE Sensitive     OXACILLIN >=4 RESISTANT Resistant     TETRACYCLINE <=1 SENSITIVE Sensitive     VANCOMYCIN  1 SENSITIVE Sensitive     TRIMETH/SULFA <=10 SENSITIVE Sensitive     CLINDAMYCIN  <=0.25 SENSITIVE Sensitive     RIFAMPIN <=0.5 SENSITIVE Sensitive     Inducible Clindamycin  NEGATIVE Sensitive     LINEZOLID  2 SENSITIVE Sensitive     * FEW METHICILLIN RESISTANT STAPHYLOCOCCUS AUREUS     Labs: BNP (last 3 results) No results for input(s): BNP in the last 8760 hours. Basic Metabolic Panel: Recent Labs  Lab 04/11/24 1530 04/11/24 1641 04/12/24 0546 04/13/24 0517 04/14/24 0548 04/15/24 0515  NA 137  --  138 137 136 138  K 2.4*  --  2.9* 3.1* 3.3* 3.7  CL 95*  --  101 102 102 105  CO2 29  --  26 24 24 24   GLUCOSE 110*  --  93 112* 106* 105*  BUN 6  --  5* 5* <5* <5*  CREATININE 0.67  --  0.60 0.61 0.55 0.57  CALCIUM 9.1  --  8.3* 8.4* 8.3* 8.5*  MG  --  2.1 2.1  --   --   --    Liver Function Tests: Recent Labs  Lab 04/11/24 1530  AST 18  ALT 36  ALKPHOS 103  BILITOT 1.0  PROT 7.4  ALBUMIN 3.5   No results for input(s): LIPASE, AMYLASE in the last 168 hours. No results for input(s): AMMONIA in the last 168 hours. CBC: Recent Labs  Lab 04/11/24 1530 04/12/24 0546 04/13/24 0517 04/14/24 0548 04/15/24 0515  WBC 16.9* 19.4* 21.4* 20.6* 15.1*  NEUTROABS 12.6*  --   --  16.3* 11.4*  HGB  13.0 11.6* 10.7* 10.4* 10.2*  HCT 37.8 34.1* 32.5* 31.4* 31.2*  MCV 92.9 95.3 97.0 96.9 97.8  PLT 331 306 319 337 374   Cardiac Enzymes: No results for input(s): CKTOTAL, CKMB, CKMBINDEX,  TROPONINI in the last 168 hours. BNP: Invalid input(s): POCBNP CBG: No results for input(s): GLUCAP in the last 168 hours. D-Dimer No results for input(s): DDIMER in the last 72 hours. Hgb A1c No results for input(s): HGBA1C in the last 72 hours. Lipid Profile No results for input(s): CHOL, HDL, LDLCALC, TRIG, CHOLHDL, LDLDIRECT in the last 72 hours. Thyroid function studies No results for input(s): TSH, T4TOTAL, T3FREE, THYROIDAB in the last 72 hours.  Invalid input(s): FREET3 Anemia work up No results for input(s): VITAMINB12, FOLATE, FERRITIN, TIBC, IRON, RETICCTPCT in the last 72 hours. Urinalysis    Component Value Date/Time   COLORURINE YELLOW 04/11/2024 1600   APPEARANCEUR CLEAR 04/11/2024 1600   LABSPEC 1.009 04/11/2024 1600   PHURINE 6.0 04/11/2024 1600   GLUCOSEU NEGATIVE 04/11/2024 1600   HGBUR SMALL (A) 04/11/2024 1600   BILIRUBINUR NEGATIVE 04/11/2024 1600   KETONESUR NEGATIVE 04/11/2024 1600   PROTEINUR NEGATIVE 04/11/2024 1600   UROBILINOGEN 0.2 09/15/2014 2007   NITRITE NEGATIVE 04/11/2024 1600   LEUKOCYTESUR NEGATIVE 04/11/2024 1600   Sepsis Labs Recent Labs  Lab 04/12/24 0546 04/13/24 0517 04/14/24 0548 04/15/24 0515  WBC 19.4* 21.4* 20.6* 15.1*   Microbiology Recent Results (from the past 240 hours)  Culture, blood (Routine x 2)     Status: None (Preliminary result)   Collection Time: 04/11/24  3:30 PM   Specimen: BLOOD LEFT ARM  Result Value Ref Range Status   Specimen Description   Final    BLOOD LEFT ARM Performed at [facility], [address], [place], [place] [address]    Special Requests   Final    BOTTLES DRAWN AEROBIC AND ANAEROBIC Blood Culture adequate volume Performed at [facility], [address], [place], [place] [address]    Culture   Final    NO GROWTH 4 DAYS Performed at [facility], [address] [place], [place] [address]    Report Status  PENDING  Incomplete  Culture, blood (Routine x 2)     Status: None (Preliminary result)   Collection Time: 04/11/24  3:37 PM   Specimen: BLOOD RIGHT ARM  Result Value Ref Range Status   Specimen Description   Final    BLOOD RIGHT ARM Performed at [facility], [address] [place], [place] [address]    Special Requests   Final    BOTTLES DRAWN AEROBIC AND ANAEROBIC Blood Culture results may not be optimal due to an inadequate volume of blood received in culture bottles Performed at [facility], [address], [place], [place] [address]    Culture   Final    NO GROWTH 4 DAYS Performed at [facility], [address], [place], [place] [address]    Report Status PENDING  Incomplete  Resp panel by RT-PCR (RSV, Flu A&B, Covid) Anterior Nasal Swab     Status: None   Collection Time: 04/11/24  3:47 PM   Specimen: Anterior Nasal Swab  Result Value Ref Range Status   SARS Coronavirus 2 by RT PCR NEGATIVE NEGATIVE Final    Comment: (NOTE) SARS-CoV-2 target nucleic acids are NOT DETECTED.  The SARS-CoV-2 RNA is generally detectable in upper respiratory specimens during the acute phase of infection. The lowest  concentration of SARS-CoV-2 viral copies this assay can detect is 138 copies/mL. A negative result does not preclude SARS-Cov-2 infection and should not be used as the sole basis for treatment or other patient management decisions. A negative result may occur with  improper specimen collection/handling, submission of specimen other than nasopharyngeal swab, presence of viral mutation(s) within the areas targeted by this assay, and inadequate number of viral copies(<138 copies/mL). A negative result must be combined with clinical observations, patient history, and epidemiological information. The expected result is Negative.  Fact Sheet for Patients:  bloggercourse.com  Fact Sheet for Healthcare Providers:   seriousbroker.it  This test is no t yet approved or cleared by the [place]  FDA and  has been authorized for detection and/or diagnosis of SARS-CoV-2 by FDA under an Emergency Use Authorization (EUA). This EUA will remain  in effect (meaning this test can be used) for the duration of the COVID-19 declaration under Section 564(b)(1) of the Act, 21 U.S.C.section 360bbb-3(b)(1), unless the authorization is terminated  or revoked sooner.       Influenza A by PCR NEGATIVE NEGATIVE Final   Influenza B by PCR NEGATIVE NEGATIVE Final    Comment: (NOTE) The Xpert Xpress SARS-CoV-2/FLU/RSV plus assay is intended as an aid in the diagnosis of influenza from Nasopharyngeal swab specimens and should not be used as a sole basis for treatment. Nasal washings and aspirates are unacceptable for Xpert Xpress SARS-CoV-2/FLU/RSV testing.  Fact Sheet for Patients: bloggercourse.com  Fact Sheet for Healthcare Providers: seriousbroker.it  This test is not yet approved or cleared by the [place]  FDA and has been authorized for detection and/or diagnosis of SARS-CoV-2 by FDA under an Emergency Use Authorization (EUA). This EUA will remain in effect (meaning this test can be used) for the duration of the COVID-19 declaration under Section 564(b)(1) of the Act, 21 U.S.C. section 360bbb-3(b)(1), unless the authorization is terminated or revoked.     Resp Syncytial Virus by PCR NEGATIVE NEGATIVE Final    Comment: (NOTE) Fact Sheet for Patients: bloggercourse.com  Fact Sheet for Healthcare Providers: seriousbroker.it  This test is not yet approved or cleared by the [place]  FDA and has been authorized for detection and/or diagnosis of SARS-CoV-2 by FDA under an Emergency Use Authorization (EUA). This EUA will remain in effect (meaning this test can be used) for  the duration of the COVID-19 declaration under Section 564(b)(1) of the Act, 21 U.S.C. section 360bbb-3(b)(1), unless the authorization is terminated or revoked.  Performed at [facility], [address] [place], [place] [address]   MRSA Next Gen by PCR, Nasal     Status: Abnormal   Collection Time: 04/11/24 10:34 PM   Specimen: Nasal Mucosa; Nasal Swab  Result Value Ref Range Status   MRSA by PCR Next Gen DETECTED (A) NOT DETECTED Final    Comment: CRITICAL RESULT CALLED TO, READ BACK BY AND VERIFIED WITH: SHEN, P. EN AT 0222 04/12/2024 BY JEREMY C (NOTE) The GeneXpert MRSA Assay (FDA approved for NASAL specimens only), is one component of a comprehensive MRSA colonization surveillance program. It is not intended to diagnose MRSA infection nor to guide or monitor treatment for MRSA infections. Test performance is not FDA approved in patients less than 63 years old. Performed at [facility], [address] [place], [place] [address]   Expectorated Sputum Assessment w Gram Stain, Rflx to Resp Cult     Status: None   Collection Time: 04/11/24 11:26 PM  Specimen: Expectorated Sputum  Result Value Ref Range Status   Specimen Description EXPECTORATED SPUTUM  Final   Special Requests NONE  Final   Sputum evaluation   Final    THIS SPECIMEN IS ACCEPTABLE FOR SPUTUM CULTURE Performed at [facility], [address], [place], [place] [address]    Report Status 04/12/2024 FINAL  Final  Culture, Respiratory w Gram Stain     Status: None   Collection Time: 04/11/24 11:26 PM  Result Value Ref Range Status   Specimen Description   Final    EXPECTORATED SPUTUM Performed at [facility], [address], [place], [place] [address]    Special Requests   Final    NONE Reflexed from [id number] Performed at [facility], [address], [place], [place] [address]    Gram Stain   Final    RARE WBC  PRESENT,BOTH PMN AND MONONUCLEAR FEW GRAM POSITIVE COCCI IN PAIRS Performed at [facility], [address] [place], [place] [address]    Culture FEW METHICILLIN RESISTANT STAPHYLOCOCCUS AUREUS  Final   Report Status 04/14/2024 FINAL  Final   Organism ID, Bacteria METHICILLIN RESISTANT STAPHYLOCOCCUS AUREUS  Final      Susceptibility   Methicillin resistant staphylococcus aureus - MIC*    CIPROFLOXACIN  >=8 RESISTANT Resistant     ERYTHROMYCIN >=8 RESISTANT Resistant     GENTAMICIN <=0.5 SENSITIVE Sensitive     OXACILLIN >=4 RESISTANT Resistant     TETRACYCLINE <=1 SENSITIVE Sensitive     VANCOMYCIN  1 SENSITIVE Sensitive     TRIMETH/SULFA <=10 SENSITIVE Sensitive     CLINDAMYCIN  <=0.25 SENSITIVE Sensitive     RIFAMPIN <=0.5 SENSITIVE Sensitive     Inducible Clindamycin  NEGATIVE Sensitive     LINEZOLID  2 SENSITIVE Sensitive     * FEW METHICILLIN RESISTANT STAPHYLOCOCCUS AUREUS     Time coordinating discharge: 35 minutes  SIGNED:   Renato Applebaum, MD  [facility] 04/16/2024, 8:10 AM

## 2024-04-16 LAB — CULTURE, BLOOD (ROUTINE X 2)
Culture: NO GROWTH
Culture: NO GROWTH
Special Requests: ADEQUATE
